# Patient Record
Sex: Female | Born: 1960 | Race: Black or African American | Hispanic: No | Smoking: Never smoker
Health system: Southern US, Community
[De-identification: ages and names within clinical notes are randomized; demographics above are authoritative.]

## PROBLEM LIST (undated history)

## (undated) DIAGNOSIS — IMO0002 Reserved for concepts with insufficient information to code with codable children: Secondary | ICD-10-CM

---

## 2000-12-14 ENCOUNTER — Other Ambulatory Visit: Admission: RE | Admit: 2000-12-14 | Discharge: 2000-12-14 | Payer: Self-pay | Admitting: Family Medicine

## 2000-12-30 ENCOUNTER — Ambulatory Visit (HOSPITAL_COMMUNITY): Admission: RE | Admit: 2000-12-30 | Discharge: 2000-12-30 | Payer: Self-pay | Admitting: Family Medicine

## 2000-12-30 ENCOUNTER — Encounter: Payer: Self-pay | Admitting: Family Medicine

## 2001-02-05 ENCOUNTER — Encounter: Payer: Self-pay | Admitting: Family Medicine

## 2001-02-05 ENCOUNTER — Encounter: Admission: RE | Admit: 2001-02-05 | Discharge: 2001-02-05 | Payer: Self-pay | Admitting: Family Medicine

## 2001-07-10 ENCOUNTER — Emergency Department (HOSPITAL_COMMUNITY): Admission: EM | Admit: 2001-07-10 | Discharge: 2001-07-10 | Payer: Self-pay | Admitting: Emergency Medicine

## 2001-07-10 ENCOUNTER — Encounter: Payer: Self-pay | Admitting: Emergency Medicine

## 2002-06-25 ENCOUNTER — Emergency Department (HOSPITAL_COMMUNITY): Admission: EM | Admit: 2002-06-25 | Discharge: 2002-06-25 | Payer: Self-pay | Admitting: *Deleted

## 2002-06-25 ENCOUNTER — Encounter: Payer: Self-pay | Admitting: *Deleted

## 2002-08-10 ENCOUNTER — Emergency Department (HOSPITAL_COMMUNITY): Admission: EM | Admit: 2002-08-10 | Discharge: 2002-08-10 | Payer: Self-pay | Admitting: *Deleted

## 2002-08-17 ENCOUNTER — Other Ambulatory Visit: Admission: RE | Admit: 2002-08-17 | Discharge: 2002-08-17 | Payer: Self-pay | Admitting: Family Medicine

## 2004-01-10 ENCOUNTER — Other Ambulatory Visit: Admission: RE | Admit: 2004-01-10 | Discharge: 2004-01-10 | Payer: Self-pay | Admitting: Obstetrics and Gynecology

## 2004-01-23 ENCOUNTER — Ambulatory Visit (HOSPITAL_COMMUNITY): Admission: RE | Admit: 2004-01-23 | Discharge: 2004-01-23 | Payer: Self-pay | Admitting: Obstetrics and Gynecology

## 2004-02-08 ENCOUNTER — Ambulatory Visit: Payer: Self-pay | Admitting: Orthopedic Surgery

## 2005-04-11 ENCOUNTER — Ambulatory Visit (HOSPITAL_COMMUNITY): Admission: RE | Admit: 2005-04-11 | Discharge: 2005-04-11 | Payer: Self-pay | Admitting: Family Medicine

## 2006-07-13 ENCOUNTER — Ambulatory Visit (HOSPITAL_COMMUNITY): Admission: RE | Admit: 2006-07-13 | Discharge: 2006-07-13 | Payer: Self-pay | Admitting: Family Medicine

## 2007-08-11 ENCOUNTER — Ambulatory Visit (HOSPITAL_COMMUNITY): Admission: RE | Admit: 2007-08-11 | Discharge: 2007-08-11 | Payer: Self-pay | Admitting: Family Medicine

## 2008-08-21 ENCOUNTER — Ambulatory Visit (HOSPITAL_COMMUNITY): Admission: RE | Admit: 2008-08-21 | Discharge: 2008-08-21 | Payer: Self-pay | Admitting: Family Medicine

## 2008-09-11 ENCOUNTER — Encounter: Admission: RE | Admit: 2008-09-11 | Discharge: 2008-09-11 | Payer: Self-pay | Admitting: Family Medicine

## 2009-06-25 ENCOUNTER — Encounter: Payer: Self-pay | Admitting: Orthopedic Surgery

## 2009-06-26 ENCOUNTER — Telehealth: Payer: Self-pay | Admitting: Orthopedic Surgery

## 2009-06-26 ENCOUNTER — Ambulatory Visit: Payer: Self-pay | Admitting: Orthopedic Surgery

## 2009-06-26 DIAGNOSIS — M5412 Radiculopathy, cervical region: Secondary | ICD-10-CM | POA: Insufficient documentation

## 2009-06-26 DIAGNOSIS — S139XXA Sprain of joints and ligaments of unspecified parts of neck, initial encounter: Secondary | ICD-10-CM

## 2009-06-26 DIAGNOSIS — M5020 Other cervical disc displacement, unspecified cervical region: Secondary | ICD-10-CM

## 2009-06-27 ENCOUNTER — Encounter (INDEPENDENT_AMBULATORY_CARE_PROVIDER_SITE_OTHER): Payer: Self-pay | Admitting: *Deleted

## 2009-06-27 DIAGNOSIS — R209 Unspecified disturbances of skin sensation: Secondary | ICD-10-CM

## 2009-06-29 ENCOUNTER — Encounter (INDEPENDENT_AMBULATORY_CARE_PROVIDER_SITE_OTHER): Payer: Self-pay | Admitting: *Deleted

## 2009-07-03 ENCOUNTER — Ambulatory Visit (HOSPITAL_COMMUNITY): Admission: RE | Admit: 2009-07-03 | Discharge: 2009-07-03 | Payer: Self-pay | Admitting: Orthopedic Surgery

## 2009-07-04 ENCOUNTER — Telehealth: Payer: Self-pay | Admitting: Orthopedic Surgery

## 2009-07-05 ENCOUNTER — Telehealth (INDEPENDENT_AMBULATORY_CARE_PROVIDER_SITE_OTHER): Payer: Self-pay | Admitting: *Deleted

## 2009-07-05 ENCOUNTER — Telehealth: Payer: Self-pay | Admitting: Orthopedic Surgery

## 2009-07-16 ENCOUNTER — Encounter: Payer: Self-pay | Admitting: Orthopedic Surgery

## 2009-08-01 ENCOUNTER — Encounter: Payer: Self-pay | Admitting: Orthopedic Surgery

## 2009-08-03 ENCOUNTER — Encounter (HOSPITAL_COMMUNITY): Admission: RE | Admit: 2009-08-03 | Discharge: 2009-09-02 | Payer: Self-pay | Admitting: Orthopedic Surgery

## 2009-08-03 ENCOUNTER — Encounter: Payer: Self-pay | Admitting: Orthopedic Surgery

## 2009-09-05 ENCOUNTER — Encounter: Payer: Self-pay | Admitting: Orthopedic Surgery

## 2009-09-05 ENCOUNTER — Encounter (HOSPITAL_COMMUNITY): Admission: RE | Admit: 2009-09-05 | Discharge: 2009-10-05 | Payer: Self-pay | Admitting: Orthopedic Surgery

## 2009-09-11 ENCOUNTER — Encounter: Payer: Self-pay | Admitting: Orthopedic Surgery

## 2009-10-03 ENCOUNTER — Telehealth: Payer: Self-pay | Admitting: Orthopedic Surgery

## 2009-10-15 ENCOUNTER — Ambulatory Visit (HOSPITAL_COMMUNITY): Admission: RE | Admit: 2009-10-15 | Discharge: 2009-10-15 | Payer: Self-pay | Admitting: Family Medicine

## 2010-02-25 ENCOUNTER — Encounter: Payer: Self-pay | Admitting: Family Medicine

## 2010-03-05 NOTE — Medication Information (Signed)
Summary: Tax adviser   Imported By: Cammie Sickle 06/26/2009 19:41:46  _____________________________________________________________________  External Attachment:    Type:   Image     Comment:   External Document

## 2010-03-05 NOTE — Miscellaneous (Signed)
Summary: Physical therapy order  Physical therapy order   Imported By: Cammie Sickle 06/26/2009 16:01:22  _____________________________________________________________________  External Attachment:    Type:   Image     Comment:   External Document

## 2010-03-05 NOTE — Progress Notes (Signed)
Summary: call from patient, fol'g physical therapy  Phone Note Call from Patient   Caller: Patient Summary of Call: Patient called to relay that she has finished physical therapy and had been doing better; states pain has started again from RT shoulder & into back of neck-no injury.   I scheduled for appointment, 10/15/09.  Please advise if any other or different recommendation. Initial call taken by: Cammie Sickle,  October 03, 2009 12:40 PM  Follow-up for Phone Call        she doesnt need to come in for that   i have seen her, rec treatment   niothing elsoe I can do at this opoint   she shoudl do her exercises and take the medications she has    Follow-up by: Fuller Canada MD,  October 04, 2009 8:29 AM  Additional Follow-up for Phone Call Additional follow up Details #1::        advised patient. Additional Follow-up by: Cammie Sickle,  October 04, 2009 11:02 AM

## 2010-03-05 NOTE — Progress Notes (Signed)
Summary: asking if okay to continue bowling  Phone Note Call from Patient   Caller: Patient Summary of Call: Patient asked about bowling. Should she discontinue at this time?  Ph [phone]? Initial call taken by: Cammie Sickle,  Jun 26, 2009 3:36 PM  Follow-up for Phone Call        yes Follow-up by: Fuller Canada MD,  Jun 26, 2009 3:55 PM  Additional Follow-up for Phone Call Additional follow up Details #1::        Tried calling patient, call not going through.  - Called and reached patient 5:23pm and advised. Additional Follow-up by: Cammie Sickle,  Jun 26, 2009 5:02 PM

## 2010-03-05 NOTE — Progress Notes (Signed)
Summary: Initial Evaluation  Initial Evaluation   Imported By: Jacklynn Ganong 06/26/2009 09:57:05  _____________________________________________________________________  External Attachment:    Type:   Image     Comment:   External Document

## 2010-03-05 NOTE — Miscellaneous (Signed)
Summary: PT progress note  PT progress note   Imported By: Jacklynn Ganong 09/05/2009 13:28:53  _____________________________________________________________________  External Attachment:    Type:   Image     Comment:   External Document

## 2010-03-05 NOTE — Assessment & Plan Note (Signed)
Summary: EVAL/TREAT CERVICAL PAIN/NEEDS XRAY/REF [facility]   Vital Signs:  Patient profile:   50 year old female Height:      65 inches Weight:      172 pounds Pulse rate:   72 / minute Resp:     16 per minute  Vitals Entered By: Fuller Canada MD (Jun 26, 2009 1:54 PM)  Visit Type:  new patient Referring Provider:  Dr. Regino Schultze Primary Provider:  [facility]  CC:  neck pain.  History of Present Illness: I saw Kari Fry in the office today for an initial visit.  She is a 50 years old woman with the complaint of:  neck pain.  Xrays today.  Meds:Hydrocodone 5, Naproxen, Colace.  This is a 50 year old female complains of increasing neck pain over the last 4 weeks without any history of injury.  Her activities include bowling.  She complains of headaches on the RIGHT side of her neck which radiated down into her shoulder and arm and down into her hand.  Her pain level is 8/10 it is constant without medication, the pain is always there it came on rather suddenly.  Pain relief is noted with Naprosyn and Vicodin.  She says there is no grip strength deficit or numbness just pain      Allergies: 1)  ! Sulfa 2)  ! * Chlorphen 3)  ! 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 Respiratory:  Complains of couch; denies short of breath, wheezing, tightness, pain on inspiration, and snoring . Gastrointestinal:  Complains of constipation; denies heartburn, nausea, vomiting, diarrhea, and blood in your stools. Neurologic:  Complains of tingling, unsteady gait, and dizziness; denies numbness, tremors, and seizure. Musculoskeletal:  Complains of joint pain, stiffness, and muscle pain; denies swelling,  instability, redness, and heat. Endocrine:  Complains of heat or cold intolerance; denies excessive thirst and exessive urination. Immunology:  Complains of seasonal allergies; denies sinus problems and allergic to bee stings.  The review of systems is negative for Cardiovascular, Genitourinary, Psychiatric, Skin, HEENT, and Hemoatologic.  Physical Exam  Skin:  intact without lesions or rashes Cervical Nodes:  no significant adenopathy Axillary Nodes:  no significant adenopathy Psych:  alert and cooperative; normal mood and affect; normal attention span and concentration Additional Exam:  She has weakness in her triceps bilaterally all the other muscles are normal in the upper extremities   Detailed Back/Spine Exam  General:    Well-developed, well-nourished, in no acute distress; alert and oriented x 3.    Gait:    Normal heel-toe gait pattern bilaterally.    Skin:    Intact with no erythema; no scarring.    Vascular:    dorsalis pedis and posterior tibial pulses 2+ and symmetric, capillary refill < 2 seconds, normal hair pattern, no evidence of ischemia.   Cervical Exam:  Inspection-deformity:    Abnormal    flexion seems normal there is some pain with extension and also with rotation Palpation-spinal tenderness:  Abnormal    Location:  C6-C7, C4 and 5, C3 and 4 Spurling Maneuver:    negative Hoffman's Sign:    Right:  negative    Left:  negative   Impression & Recommendations:  Problem # 1:  H N P-CERVICAL (ICD-722.0) Assessment  New  AP lateral cervical spine x-ray  xrays inferior ossicle at [facility] looks normal   Impression, normal appearing cervical spine except for a small ossicle at C4 inferior margin  Problem # 2:  CERVICAL RADICULITIS (ICD-723.4) Assessment: New  Problem # 3:  CERVICAL SPASM (ICD-847.0) Assessment: New  MRI to evaluate cervical spine for disc herniation  Nerve conduction study to rule out carpal tunnel syndrome and cervical  radiculopathy  Physical therapy to improve range of motion and decrease pain  Start prednisone 12 a pack 10 mg, Norflex 100 mg b.i.d. for spasm #60 one refill  Norco 5 mg one q.6 p.r.n. for pain #60 one refill    Her updated medication list for this problem includes:    Norco 5-325 Mg Tabs (Hydrocodone-acetaminophen) ..... One every 6 hours p.r.n. pain  Medications Added to Medication List This Visit: 1)  Norco 5-325 Mg Tabs (Hydrocodone-acetaminophen) .... One every 6 hours p.r.n. pain 2)  Prednisone (pak) 10 Mg Tabs (Prednisone) .Marland Kitchen.. 12 days as directed 3)  Norflex 100mg   .... One b.i.d.  Other Orders: New Patient Level III (16109) Cervical x-ray minimum 4 views (60454)  Patient Instructions: 1)  MRI C-SPINE  2)  NCS Radiculopathy  3)  Continue Vicodin and Naprosyn  4)  Physical therapy x 6 weeks  Prescriptions: NORFLEX 100MG  one b.i.d.  #60 x 1   Entered and Authorized by:   Fuller Canada MD   Signed by:   Fuller Canada MD on 06/27/2009   Method used:   Handwritten   RxID:   [id number] PREDNISONE (PAK) 10 MG TABS (PREDNISONE) 12 days as directed  #1 x 0   Entered and Authorized by:   Fuller Canada MD   Signed by:   Fuller Canada MD on 06/27/2009   Method used:   Handwritten   RxID:   [id number] NORCO 5-325 MG TABS (HYDROCODONE-ACETAMINOPHEN) one every 6 hours p.r.n. pain  #60 x 1   Entered and Authorized by:   Fuller Canada MD   Signed by:   Fuller Canada MD on 06/27/2009   Method used:   Handwritten   RxID:   [id number]

## 2010-03-05 NOTE — Letter (Signed)
Summary: History form  History form   Imported By: Jacklynn Ganong 06/29/2009 09:31:35  _____________________________________________________________________  External Attachment:    Type:   Image     Comment:   External Document

## 2010-03-05 NOTE — Progress Notes (Signed)
  Phone Note Call from Patient   Summary of Call: Kari Fry is scheduled for NCS 07/16/09 at 12:30 Initial call taken by: Jacklynn Ganong,  July 05, 2009 1:17 PM

## 2010-03-05 NOTE — Progress Notes (Signed)
Summary: call from patient MRI results + medication question  Phone Note Call from Patient   Caller: Patient Summary of Call: Patient called to relay that she will drop off MRI CD today.  Patient states "the 3 medications prescribed are no longer helping with the pain. Office note states Dr Romeo Apple is to call with MRI results.    Cell ph # [phone]   Work ph # [phone] X [phone]  Initial call taken by: Cammie Sickle,  July 05, 2009 10:27 AM  Follow-up for Phone Call        I KNOW ABOUT THIS ALREADY  Follow-up by: Fuller Canada MD,  July 05, 2009 10:29 AM  Additional Follow-up for Phone Call Additional follow up Details #1::        Medication question was included. Please advise. Additional Follow-up by: Cammie Sickle,  July 05, 2009 10:38 AM

## 2010-03-05 NOTE — Miscellaneous (Signed)
Summary: PT Clinical evaluation  PT Clinical evaluation   Imported By: Jacklynn Ganong 08/13/2009 08:12:57  _____________________________________________________________________  External Attachment:    Type:   Image     Comment:   External Document

## 2010-03-05 NOTE — Miscellaneous (Signed)
  advised to  go ahead and start physical therapy. Continue medications, refills are okay when she runs out.  She recalls after therapy has been completedClinical Lists Changes

## 2010-03-05 NOTE — Progress Notes (Signed)
Summary: call from Dr Wallace Cullens radiology dept  Phone Note From Other Clinic   Caller: Provider Summary of Call: Dr Jena Gauss at [facility] radiology called to phone in MRI report to Dr Romeo Apple (direct ph # [phone]).   Initial call taken by: Cammie Sickle,  July 04, 2009 11:18 AM

## 2010-03-05 NOTE — Miscellaneous (Signed)
Summary: mri aph neck 07/03/09 reg 530pm  Clinical Lists Changes  hc precert number [id number] expires 08/13/09, advised pt of appt, dr to call with results

## 2010-03-05 NOTE — Miscellaneous (Signed)
Summary: PT Discharge summary  PT Discharge summary   Imported By: Jacklynn Ganong 09/18/2009 12:03:53  _____________________________________________________________________  External Attachment:    Type:   Image     Comment:   External Document

## 2010-03-05 NOTE — Miscellaneous (Signed)
Summary: ncs uppers referral  Clinical Lists Changes  Problems: Added new problem of NUMBNESS, HAND (ICD-782.0) Orders: Added new Referral order of Neurology Referral (Neuro) - Signed  Appended Document: n[facility] uppers referral faxed to [facility] office

## 2010-06-21 NOTE — Op Note (Signed)
Kari Fry, Kari Fry                          ACCOUNT NO.:  [id number]   MEDICAL RECORD NO.:  [id number]                   PATIENT TYPE:  MS   LOCATION:                                       FACILITY:  [facility]   PHYSICIAN:  Althea Grimmer. Luther Parody, M.D.            DATE OF BIRTH:  [birth year]   DATE OF PROCEDURE:  08/31/2001  DATE OF DISCHARGE:  07/10/2001                                 OPERATIVE REPORT   PROCEDURE PERFORMED:  Endoscopic retrograde cholangiopancreatogram with  sphincterotomy and common bile duct stone extraction.   ENDOSCOPIST:  Althea Grimmer. Luther Parody, M.D.   INDICATIONS FOR PROCEDURE:  Obstructive jaundice. The admission history and  physical are dictated separately.   PREPARATION:  The patient is n.p.o. since clear liquid breakfast.   PREPROCEDURE MEDICATIONS:  She received 400 mg of ciprofloxacin IV.  She was  sedated with 130 mg of Demerol IV during the procedure and 10 mg of Versed.  In addition her throat was anesthetized with Cetacaine spray.  She was on  nasal cannula O2 and received 1.25 mg of Glucagon IV.   DESCRIPTION OF PROCEDURE:  The Olympus video duodenoscope was inserted by  mouth and advanced fairly easily through the stomach and duodenal bulb to  the second duodenum.  The scope was then retroflexed and the ampulla  visualized.  This was easily cannulated with the sphinctertome preloaded  with guidewire.  The initial injection led to some dye entering both ducts.  The catheter was repositioned and the guidewire was easily advanced up the  common bile duct.  Stones were immediately visualized in the distal common  bile duct on dye injection.  A moderate sphincterotomy was performed and a  balloon catheter exchanged with a graded balloon was made.  The balloon was  dilated to 8 mm and pulled through the common bile duct with minimal  delivery of stone material or debris; however, there appeared to be a  relatively large stone in the distal common bile duct.   12 mm balloon  inflation similarly did not remove the stone.  The balloon catheter was  removed.  The sphincterotomy was extended and the procedure was repeated.  More debris was delivered but a stone still appeared to be impacted.  A  third extension of the sphincterotomy appeared to open the internal aspect  of the sphincter better and on recannulation of the common bile duct with a  12 mm balloon, multiple stones were delivered which were at least 0.8 to 10  mm in size.  The bile then drained easily from the common bile duct which  appeared to be free of stone material.  The procedure was terminated.  The  patient tolerated the ERCP well.  Pulse, blood pressure and oximetry testing  were stable throughout. She was observed in recovery for one hour and  discharged to her hospital room, admitted for elective cholecystectomy  scheduled  for the morning.   IMPRESSION:  Successful endoscopic retrograde cholangiopancreatogram,  sphincterotomy and stone extraction.    PLAN:  The patient will be observed, hydrated and kept on clear fluids in  preparation for her cholecystectomy in the morning.                                               Althea Grimmer. Luther Parody, M.D.    PJS/MEDQ  D:  08/31/2001  T:  09/02/2001  Job:  [id number]   cc:   Skeet Simmer., M.D.  Fax: [phone]   Ellin Saba., M.D.

## 2010-09-12 ENCOUNTER — Encounter: Payer: Self-pay | Admitting: Orthopedic Surgery

## 2010-09-12 ENCOUNTER — Ambulatory Visit (INDEPENDENT_AMBULATORY_CARE_PROVIDER_SITE_OTHER): Payer: Commercial Managed Care - PPO | Admitting: Orthopedic Surgery

## 2010-09-12 DIAGNOSIS — M542 Cervicalgia: Secondary | ICD-10-CM

## 2010-09-12 DIAGNOSIS — M5030 Other cervical disc degeneration, unspecified cervical region: Secondary | ICD-10-CM

## 2010-09-12 NOTE — Patient Instructions (Addendum)
Don't pull any file cabinet drawer  2lb weight limit in each hand   Add traction daily

## 2010-09-12 NOTE — Progress Notes (Signed)
   50 year old female previously evaluated for neck pain had an MRI which showed multilevel degenerative disc protrusions without impingement or myelopathy.  She was treated with physical therapy, Norflex, anti-inflammatory and hydrocodone.  She eventually finished her physical therapy and her pain is persisting including some pain radiating up into the neck and head area.  She denies any numbness tingling or weakness of her upper extremities.  She is concerned about the amount of lifting she is having to do at work and would like a note saying that she can't lift heavy objects or pull on the heavy file cabinet.  She says that the therapy is too expensive although it did help and she liked it.  Current medications as noted primarily relying on 4 mg of ibuprofen with occasional hydrocodone 5 mg and Norflex 100 mg.  C2-3: There is a small central disc protrusion which touches the  ventral aspect of spinal cord but causes no focal nerve root  impingement or myelopathy.  C3-4: There is a small central disc protrusion which indents the  central aspect of the spinal cord without myelopathy or focal nerve  root impingement.  C4-5: There is a small central disc protrusion which touches the  ventral aspect of the spinal cord without myelopathy or focal nerve  root impingement.   Pain is increased when she is under stress, if she is doing any straining or lifting including Valsalva maneuver when having bowel movements  Clinical exam is benign  I certainly think she would benefit from lifting restrictions.  We will write her a note as she has requested.  She does not have any radicular symptoms although she does have some suggestion of cord pressure from the Valsalva maneuver but this is in no way constant.  I recommended she try chiropractic manipulation, cervical traction at home 5 pounds 15 minutes per day as needed continue ibuprofen and for breakthrough pain his hydrocodone.  Follow up as  needed

## 2010-11-12 ENCOUNTER — Other Ambulatory Visit (HOSPITAL_COMMUNITY): Payer: Self-pay | Admitting: Physician Assistant

## 2010-11-12 DIAGNOSIS — Z139 Encounter for screening, unspecified: Secondary | ICD-10-CM

## 2010-11-22 ENCOUNTER — Ambulatory Visit (HOSPITAL_COMMUNITY)
Admission: RE | Admit: 2010-11-22 | Discharge: 2010-11-22 | Disposition: A | Discharge status: Home / Self Care | Payer: BC Managed Care – PPO | Source: Ambulatory Visit | Attending: Physician Assistant | Admitting: Physician Assistant

## 2010-11-22 DIAGNOSIS — Z1231 Encounter for screening mammogram for malignant neoplasm of breast: Secondary | ICD-10-CM | POA: Insufficient documentation

## 2010-11-22 DIAGNOSIS — Z139 Encounter for screening, unspecified: Secondary | ICD-10-CM

## 2010-12-01 ENCOUNTER — Emergency Department (HOSPITAL_COMMUNITY): Payer: BC Managed Care – PPO

## 2010-12-01 ENCOUNTER — Emergency Department (HOSPITAL_COMMUNITY)
Admission: EM | Admit: 2010-12-01 | Discharge: 2010-12-01 | Disposition: A | Discharge status: Home / Self Care | Payer: BC Managed Care – PPO | Attending: Emergency Medicine | Admitting: Emergency Medicine

## 2010-12-01 DIAGNOSIS — X500XXA Overexertion from strenuous movement or load, initial encounter: Secondary | ICD-10-CM | POA: Insufficient documentation

## 2010-12-01 DIAGNOSIS — S93409A Sprain of unspecified ligament of unspecified ankle, initial encounter: Secondary | ICD-10-CM

## 2010-12-01 DIAGNOSIS — Y92009 Unspecified place in unspecified non-institutional (private) residence as the place of occurrence of the external cause: Secondary | ICD-10-CM | POA: Insufficient documentation

## 2010-12-01 HISTORY — DX: Reserved for concepts with insufficient information to code with codable children: IMO0002

## 2010-12-01 MED ORDER — HYDROCODONE-ACETAMINOPHEN 5-325 MG PO TABS
1.0000 | ORAL_TABLET | Freq: Once | ORAL | Status: AC
  Administered 2010-12-01: 1 via ORAL
  Filled 2010-12-01: qty 1

## 2010-12-01 MED ORDER — IBUPROFEN 800 MG PO TABS
800.0000 mg | ORAL_TABLET | Freq: Once | ORAL | Status: AC
  Administered 2010-12-01: 800 mg via ORAL
  Filled 2010-12-01: qty 1

## 2010-12-01 MED ORDER — IBUPROFEN 800 MG PO TABS: 800.0000 mg | ORAL_TABLET | Freq: Three times a day (TID) | ORAL | Status: AC | PRN

## 2010-12-01 MED ORDER — HYDROCODONE-ACETAMINOPHEN 5-325 MG PO TABS: 1.0000 | ORAL_TABLET | ORAL | Status: AC | PRN

## 2010-12-01 NOTE — ED Notes (Signed)
Pt presents with right ankle pain and swelling after stepping in a dip in her yard today. Pt twisted ankle. Pt unable to put weight on ankle.

## 2010-12-01 NOTE — ED Provider Notes (Signed)
History     CSN: [id number] Arrival date & time: 12/01/2010  5:22 PM   First MD Initiated Contact with Patient 12/01/10 1718      Chief Complaint  Patient presents with  . Ankle Injury    (Consider location/radiation/quality/duration/timing/severity/associated sxs/prior treatment) Patient is a 50 y.o. female presenting with lower extremity injury. The history is provided by the patient.  Ankle Injury This is a new (she twisted her ankle stepping in a hole in her yard today.) problem. The current episode started today. The problem occurs constantly. The problem has been unchanged. Associated symptoms include arthralgias and joint swelling. Pertinent negatives include no chest pain, fever, nausea, neck pain, numbness, rash, sore throat or weakness. The symptoms are aggravated by walking and standing. She has tried nothing for the symptoms. The treatment provided no relief.    Past Medical History  Diagnosis Date  . Bulging discs     History reviewed. No pertinent past surgical history.  History reviewed. No pertinent family history.  History  Substance Use Topics  . Smoking status: Never Smoker   . Smokeless tobacco: Not on file  . Alcohol Use: No    OB History    Grav Para Term Preterm Abortions TAB SAB Ect Mult Living                  Review of Systems  Constitutional: Negative for fever.  HENT: Negative for sore throat and neck pain.   Eyes: Negative.   Respiratory: Negative for chest tightness and shortness of breath.   Cardiovascular: Negative for chest pain.  Gastrointestinal: Negative for nausea.  Genitourinary: Negative.   Musculoskeletal: Positive for joint swelling and arthralgias.  Skin: Negative.  Negative for rash and wound.  Neurological: Negative for weakness and numbness.  Hematological: Negative.   Psychiatric/Behavioral: Negative.     Allergies  Fexofenadine and Sulfonamide derivatives  Home Medications   Current Outpatient Rx  Name Route  Sig Dispense Refill  . HYDROCODONE-ACETAMINOPHEN 5-325 MG PO TABS Oral Take 1 tablet by mouth every 6 (six) hours as needed.      Marland Kitchen HYDROCODONE-ACETAMINOPHEN 5-325 MG PO TABS Oral Take 1 tablet by mouth every 4 (four) hours as needed for pain. 15 tablet 0  . IBUPROFEN 800 MG PO TABS Oral Take 1 tablet (800 mg total) by mouth every 8 (eight) hours as needed for pain. 15 tablet 0  . LORATADINE 10 MG PO TABS Oral Take 10 mg by mouth daily.      . ORPHENADRINE CITRATE 100 MG PO TB12 Oral Take 100 mg by mouth 2 (two) times daily.        BP 111/80  Pulse 98  Temp(Src) 98.4 F (36.9 C) (Oral)  Resp 18  Ht 5\' 2"  (1.575 m)  Wt 166 lb (75.297 kg)  BMI 30.36 kg/m2  SpO2 100%  Physical Exam  Nursing note and vitals reviewed. Constitutional: She is oriented to person, place, and time. She appears well-developed and well-nourished.  HENT:  Head: Normocephalic.  Eyes: Conjunctivae are normal.  Neck: Normal range of motion.  Cardiovascular: Normal rate and intact distal pulses.  Exam reveals no decreased pulses.   Pulses:      Dorsalis pedis pulses are 2+ on the right side, and 2+ on the left side.       Posterior tibial pulses are 2+ on the right side, and 2+ on the left side.  Pulmonary/Chest: Effort normal.  Musculoskeletal: She exhibits edema and tenderness.  Right ankle: She exhibits decreased range of motion and swelling. She exhibits no deformity and normal pulse. tenderness. Lateral malleolus and CF ligament tenderness found. No head of 5th metatarsal and no proximal fibula tenderness found. Achilles tendon normal.       Pedal pulses intact,  Distal sensation intact.  Neurological: She is alert and oriented to person, place, and time. No sensory deficit.  Skin: Skin is warm, dry and intact.    ED Course  Procedures (including critical care time)  Labs Reviewed - No data to display Dg Ankle Complete Right  12/01/2010  *RADIOLOGY REPORT*  Clinical Data: Twisted ankle  RIGHT  ANKLE - COMPLETE 3+ VIEW  Comparison: None.  Findings: Lateral soft tissue swelling.  Normal alignment.  No fracture or effusion.  No degenerative change.  IMPRESSION: Negative for fracture.  Original Report Authenticated By: Camelia Phenes, M.D.     1. Ankle sprain       MDM  Aso, crutches,  Ibuprofen,  Hydrocodone.  RICE        Candis Musa, PA 12/01/10 1842

## 2010-12-02 NOTE — ED Provider Notes (Signed)
Medical screening examination/treatment/procedure(s) were performed by non-physician practitioner and as supervising physician I was immediately available for consultation/collaboration.   Gwyneth Sprout, MD 12/02/10 [phone]

## 2011-03-04 ENCOUNTER — Ambulatory Visit: Payer: BC Managed Care – PPO | Admitting: Orthopedic Surgery

## 2011-03-04 ENCOUNTER — Ambulatory Visit (INDEPENDENT_AMBULATORY_CARE_PROVIDER_SITE_OTHER): Payer: BC Managed Care – PPO | Admitting: Orthopedic Surgery

## 2011-03-04 ENCOUNTER — Encounter: Payer: Self-pay | Admitting: Orthopedic Surgery

## 2011-03-04 DIAGNOSIS — S93409A Sprain of unspecified ligament of unspecified ankle, initial encounter: Secondary | ICD-10-CM

## 2011-03-04 DIAGNOSIS — M76829 Posterior tibial tendinitis, unspecified leg: Secondary | ICD-10-CM

## 2011-03-04 DIAGNOSIS — S93401A Sprain of unspecified ligament of right ankle, initial encounter: Secondary | ICD-10-CM | POA: Insufficient documentation

## 2011-03-04 MED ORDER — HYDROCODONE-ACETAMINOPHEN 5-325 MG PO TABS: 1.0000 | ORAL_TABLET | Freq: Four times a day (QID) | ORAL | Status: DC | PRN

## 2011-03-04 NOTE — Progress Notes (Signed)
Patient ID: Kari Fry, female   DOB: 1960-05-11, 51 y.o.   MRN: [id number] .newprob   Pain RIGHT ankle  Started October 12  Secondary to injury patient stepped in a hole at her home.  She had a lot of swelling she went to the ER x-rays were negative she was placed in an ASO brace she will that about 2-3 weeks.  She is concerned because she still hurting.  She has more pain medial than lateral she has a history of arch problems.  She wear special shoes.  No such he can wear in a high heel shoe.  She does not have any catching or locking.  She is ALLERGIC to sulfa and amoxicillin  Review of systems noted for constipation and painful urination otherwise normal  Exam well-developed well-nourished female neurovascular exam intact lymph nodes negative skin intact neurologic exam normal.  She's awake alert and oriented x3  Ambulation is normal  She is tender on the medial side over the posterior tibial tendon.  She is a flattened arch.  She is a poor tiptoe test on repetition compared to the RIGHT.  Range of motion is otherwise normal strength seems normal.  Ankle is stable.  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 Pain one refill.

## 2011-03-04 NOTE — Patient Instructions (Signed)
Start PT 

## 2011-03-18 ENCOUNTER — Ambulatory Visit (HOSPITAL_COMMUNITY)
Admission: RE | Admit: 2011-03-18 | Discharge: 2011-03-18 | Disposition: A | Discharge status: Home / Self Care | Payer: BC Managed Care – PPO | Source: Ambulatory Visit | Attending: Internal Medicine | Admitting: Internal Medicine

## 2011-03-18 DIAGNOSIS — M6281 Muscle weakness (generalized): Secondary | ICD-10-CM | POA: Insufficient documentation

## 2011-03-18 DIAGNOSIS — M25676 Stiffness of unspecified foot, not elsewhere classified: Secondary | ICD-10-CM | POA: Insufficient documentation

## 2011-03-18 DIAGNOSIS — M25673 Stiffness of unspecified ankle, not elsewhere classified: Secondary | ICD-10-CM | POA: Insufficient documentation

## 2011-03-18 DIAGNOSIS — IMO0001 Reserved for inherently not codable concepts without codable children: Secondary | ICD-10-CM | POA: Insufficient documentation

## 2011-03-18 DIAGNOSIS — M25579 Pain in unspecified ankle and joints of unspecified foot: Secondary | ICD-10-CM | POA: Insufficient documentation

## 2011-03-18 NOTE — Evaluation (Signed)
Physical Therapy Evaluation  Patient Details  Name: Kari Fry MRN: [id number] Date of Birth: 22-May-1960  Today's Date: 03/18/2011 Time: 4098-1191 Time Calculation (min): 51 min Charges: 1 EV, 10 TE Visit#: 1  of 16   Re-eval: 04/17/11  Assessment Diagnosis: R ankle posterior tibial tendon tendonitis Next MD Visit: March 2013 Prior Therapy: none  Past Medical History:  Past Medical History  Diagnosis Date  . Bulging discs    Subjective Symptoms/Limitations Symptoms: Right ankle Oct 2012 sprained, now with continued pain in right ankle How long can you sit comfortably?: NA How long can you stand comfortably?: depends on the day How long can you walk comfortably?: can feel it whenever she walks.  Especially when she increases gait speed or incline on treadmill.   Pain Assessment Currently in Pain?: Yes Pain Score:   4 Pain Location: Ankle Pain Orientation: Right;Medial Pain Type: Chronic pain Pain Radiating Towards: posterior aspect of medial ankle Pain Onset: More than a month ago Pain Frequency: Intermittent Pain Relieving Factors: taking pain meds, did ice when she first sprained it, has tried heat pack on her ankle recently Effect of Pain on Daily Activities: takes 800 mg of ibuprofen, hurts when driving (switching from gas to breaks)  Prior Functioning  Prior Function Able to Take Stairs?: Yes (has some pain with doing stairs) Driving: Yes (has pain) Vocation: Full time employment Vocation Requirements: walking and up on her feet all day, 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    Assessment RLE Strength Right Ankle Dorsiflexion: 5/5 Right Ankle Plantar  Flexion: 5/5 Right Ankle Inversion: 4/5 Right Ankle Eversion: 4/5  Exercise/Treatments Mobility/Balance  Static Standing Balance Single Leg Stance - Right Leg: 3  Single Leg Stance - Left Leg: 36    Ankle Stretches Gastroc Stretch: 1 rep;30 seconds Ankle Exercises - Seated Toe Raise: Limitations Toe Raise Limitations: eccentric: up on both, down on right only x 8 reps Ankle Exercises - Supine T-Band: inversion and eversion, blue t-band 1 x 10 each  Physical Therapy Assessment and Plan Clinical Impression Statement: 51 y.o. female referred to [facility] for R ankle pain 4 mo s/p inversion sprain diagnosed by MD with posterior tibialis tendonitis.  She presents with decreased inversion and eversion ankle strength, significantly decreased balance on her right foot, and palpable pain on the posterior medial aspect of the ankle over her posterior tibial tendon.  She would benefit from PT for ankle strengthening, balance training and modalities for pain management.   Rehab Potential: Good PT Frequency: Min 2X/week PT Duration: 8 weeks PT Treatment/Interventions: Functional mobility training;Therapeutic activities;Therapeutic exercise;Balance training;Neuromuscular re-education;Patient/family education;Other (comment) (STM, modalities PRN for pain) PT Plan: Continue 2 xs/wk x 8 weeks, review HEP next session (t-band blue inversion and eversion, eccentric calf raises right leg, gastroc stretch), add soleus stretch, towel crunches, marbles, inversion and eversion with the towel, SLS practice, [place] pulsed, and ice at the end of the session.      Goals Home Exercise Program Pt will Perform Home Exercise Program: Independently PT Short Term Goals Time to Complete Short Term Goals: 4 weeks PT Short Term Goal 1: The patient will report average daily pain of less than 5/10 in her right ankle to show improved QOL and tolerance  of functional activities.   PT Short Term Goal 2: The patient will increase  SLS time to greater than 10 seconds on her right foot to show improved balance and decreased risk of re-spraining her right ankle.   PT Short Term Goal 3: The patient will increase her right ankle inversion and eversion strength by 1/2 muscle grade to show improved strength and ankle stability.   PT Long Term Goals Time to Complete Long Term Goals: 8 weeks PT Long Term Goal 1: The patient will report her average daily right ankle pain will be less than or equal to 3/10 to show improved QOL and tolerance of functional activities.   PT Long Term Goal 2: The patient will increase right ankle strength to 5/5 throughout to show improved ankle stability.   Long Term Goal 3: The patient will increase R SLS to equal L SLS to show equal balance and stability between ankles.  Long Term Goal 4: The patient will have no palpable pain behind her right medial malleolus to show improved sensativity to touch in this area.    Problem List Patient Active Problem List  Diagnoses  . H N P-CERVICAL  . CERVICAL RADICULITIS  . NUMBNESS, HAND  . CERVICAL SPASM  . Right ankle sprain  . Posterior tibial tendonitis   PT - End of Session Activity Tolerance: Patient tolerated treatment well General Behavior During Session: [facility] for tasks performed Cognition: [facility] for tasks performed PT Plan of Care PT Home Exercise Plan: see scanned report.   Consulted and Agree with Plan of Care: Patient  Rollene Rotunda. Ezeriah Luty, PT, DPT  03/18/2011, 6:41 PM  Physician Documentation Your signature is required to indicate approval of the treatment plan as stated above.  Please sign and either send electronically or make a copy of this report for your files and return this physician signed original.   Please mark one 1.__approve of plan  2. ___approve of plan with the following conditions.   ______________________________                                                          _____________________ Physician Signature                                                                                                              Date

## 2011-03-24 ENCOUNTER — Ambulatory Visit (HOSPITAL_COMMUNITY)
Admission: RE | Admit: 2011-03-24 | Discharge: 2011-03-24 | Disposition: A | Discharge status: Home / Self Care | Payer: BC Managed Care – PPO | Source: Ambulatory Visit | Attending: Orthopedic Surgery | Admitting: Orthopedic Surgery

## 2011-03-24 NOTE — Progress Notes (Signed)
Physical Therapy Treatment Patient Details  Name: Kari Fry MRN: [id number] Date of Birth: 1960/07/21  Today's Date: 03/24/2011 Time: 4098-1191 Time Calculation (min): 47 min Visit#: 2  of 16   Re-eval: 04/17/11 Charges: Therex x 27' [place] x 8' Ice x 10'  Subjective: Symptoms/Limitations Symptoms: Pt reports HEP compliance. Pt is pain free at entry. Pain Assessment Currently in Pain?: No/denies Pain Score: 0-No pain Pain Location: Ankle Pain Orientation: Right   Exercise/Treatments Ankle Exercises - Seated Towel Crunch: Limitations Towel Crunch Limitations: 1' Marble Pickup: 1x Heel Raises: Limitations Heel Raises Limitations: eccentric lower x 10 Ankle Exercises - Supine T-Band: inversion and eversion, blue t-band 1 x 10 each Ankle Exercises - Sidelying   Modalities Modalities: Cryotherapy;Ultrasound Cryotherapy Number Minutes Cryotherapy: 10 Minutes Cryotherapy Location: Ankle (Right) Type of Cryotherapy: Ice pack Ultrasound Ultrasound Location: R posterior tibialis Ultrasound Parameters: 1.0 w/cm2 pulsed x 8' Ultrasound Goals: Pain  Physical Therapy Assessment and Plan PT Assessment and Plan Clinical Impression Statement: Pt completes all exercises well. Attempted soleus stretch but pt unable to tolerate secondary to ankle pain. Ultrasound completed to posterior tibialis tendon to facilitate healing. Ice applied at end of session to limit inflammation. PT Plan: Continue to progress per PT POC. Assess effects of ultrasound next session.     Problem List Patient Active Problem List  Diagnoses  . H N P-CERVICAL  . CERVICAL RADICULITIS  . NUMBNESS, HAND  . CERVICAL SPASM  . Right ankle sprain  . Posterior tibial tendonitis    PT - End of Session Activity Tolerance: Patient tolerated treatment well General Behavior During Session: [facility] for tasks performed Cognition: [facility] for tasks performed  Antonieta Iba 03/24/2011, 5:38 PM

## 2011-03-26 ENCOUNTER — Ambulatory Visit (HOSPITAL_COMMUNITY): Payer: BC Managed Care – PPO | Admitting: *Deleted

## 2011-04-01 ENCOUNTER — Ambulatory Visit (HOSPITAL_COMMUNITY)
Admission: RE | Admit: 2011-04-01 | Discharge: 2011-04-01 | Disposition: A | Discharge status: Home / Self Care | Payer: BC Managed Care – PPO | Source: Ambulatory Visit | Attending: Internal Medicine | Admitting: Internal Medicine

## 2011-04-01 NOTE — Progress Notes (Signed)
Physical Therapy Treatment Patient Details  Name: CALIANNA KIM MRN: [id number] Date of Birth: 1960-06-04  Today's Date: 04/01/2011 Time: 6213-0865 Time Calculation (min): 44 min Visit#: 3  of 16   Re-eval: 04/17/11  Charge: therex:36 min [place] 8 min  Subjective: Symptoms/Limitations Symptoms: Pain free at entry "No pain unless I move it a certain way."  The [place] felt good last session. Pain Assessment Currently in Pain?: No/denies  Objective:   Exercise/Treatments Ankle Stretches Gastroc Stretch: 1 rep;30 seconds;Limitations Gastroc Stretch Limitations: with slant board Ankle Exercises - Standing SLS: 3x 30" with HHA Heel Raises: Limitations;10 reps Heel Raises Limitations: with eccentric control lowering Toe Raise: 10 reps;Limitations Toe Raise Limitations: with eccentric lowering Ankle Exercises - Seated Towel Crunch: 3 reps Towel Inversion/Eversion: 3 reps Marble Pickup: 3x  Heel Raises: Limitations Heel Raises Limitations: eccentric lower x 10 Toe Raise: Limitations Toe Raise Limitations: eccentric: up on both, down on right only x 8 reps Ankle Exercises - Supine T-Band: plantar flexion, dorsi flexion, inversion, and eversion, blue t-band 1 x 15 each Ankle Exercises - Sidelying   Modalities Modalities: Ultrasound Ultrasound Ultrasound Location: R posterior tibialis Ultrasound Parameters: 1.0 w/cm2 pulsed x 8' Ultrasound Goals: Pain  Physical Therapy Assessment and Plan PT Assessment and Plan Clinical Impression Statement: Progressed some exercises to standing, pt able to complete with no c/o.  Unstable ankle stragety noted with SLS, pt required HHA to prevent falls.  Pt stated good relief following [place], pt requested no ice at end of session, pt recommended to ice at home tonight to limit inflammation.   PT Plan: Continue progressing current POC, progress to standing activities as tolerated, add weight with towel inversion/eversion.      Goals    Problem  List Patient Active Problem List  Diagnoses  . H N P-CERVICAL  . CERVICAL RADICULITIS  . NUMBNESS, HAND  . CERVICAL SPASM  . Right ankle sprain  . Posterior tibial tendonitis    PT - End of Session Activity Tolerance: Patient tolerated treatment well General Behavior During Session: [facility] for tasks performed Cognition: [facility] for tasks performed  Juel Burrow, PTA 04/01/2011, 5:41 PM

## 2011-04-04 ENCOUNTER — Ambulatory Visit (HOSPITAL_COMMUNITY)
Admission: RE | Admit: 2011-04-04 | Discharge: 2011-04-04 | Disposition: A | Discharge status: Home / Self Care | Payer: BC Managed Care – PPO | Source: Ambulatory Visit | Attending: Internal Medicine | Admitting: Internal Medicine

## 2011-04-04 DIAGNOSIS — M25579 Pain in unspecified ankle and joints of unspecified foot: Secondary | ICD-10-CM | POA: Insufficient documentation

## 2011-04-04 DIAGNOSIS — M6281 Muscle weakness (generalized): Secondary | ICD-10-CM | POA: Insufficient documentation

## 2011-04-04 DIAGNOSIS — M25673 Stiffness of unspecified ankle, not elsewhere classified: Secondary | ICD-10-CM | POA: Insufficient documentation

## 2011-04-04 DIAGNOSIS — IMO0001 Reserved for inherently not codable concepts without codable children: Secondary | ICD-10-CM | POA: Insufficient documentation

## 2011-04-04 DIAGNOSIS — M25676 Stiffness of unspecified foot, not elsewhere classified: Secondary | ICD-10-CM | POA: Insufficient documentation

## 2011-04-04 NOTE — Progress Notes (Signed)
Physical Therapy Treatment Patient Details  Name: Kari Fry MRN: [id number] Date of Birth: 1960-05-22  Today's Date: 04/04/2011 Time: 4098-1191 Time Calculation (min): 42 min Visit#: 4  of 16   Re-eval: 04/17/11  Charge: therex 25 min [place] 8 min  Subjective: Symptoms/Limitations Symptoms: Pain free today, ankle feeling good. Pain Assessment Currently in Pain?: No/denies  Objective:   Exercise/Treatments Ankle Stretches Gastroc Stretch: 3 reps;30 seconds;Limitations Gastroc Stretch Limitations: with slant board Aerobic Exercises Tread Mill: 9' @ 2.5 Ankle Exercises - Standing SLS: 24" max of 3 with no HHA Heel Raises: Limitations;10 reps Heel Raises Limitations: with eccentric control lowering Toe Raise: 10 reps;Limitations Toe Raise Limitations: with eccentric lowering Other Standing Ankle Exercises: plantar st on 6in step 3x 20" Ankle Exercises - Seated Towel Inversion/Eversion: 3 reps;Weights Towel Inversion/Eversion Weights (lbs): 3# Marble Pickup: 3x  Heel Raises: Limitations Heel Raises Limitations: standing Toe Raise: Limitations Toe Raise Limitations: standing Modalities Modalities: Ultrasound Ultrasound Ultrasound Location: Medial malleoli, R posterior tib Ultrasound Parameters: 1.0 w/cm2 3 MHz pulsed x 8' Ultrasound Goals: Pain  Physical Therapy Assessment and Plan PT Assessment and Plan Clinical Impression Statement: Ankle stragety improving, able to SLS with no HHA today.  Able to add weights wtih inversion/eversion with noted muscular fatigue especially with eversion but able to complete with no c/o.   PT Plan: Continue with current POC, begin ankle plantar/dorsiflexion leg press machine low weight focus on eccentric control next session.     Goals    Problem List Patient Active Problem List  Diagnoses  . H N P-CERVICAL  . CERVICAL RADICULITIS  . NUMBNESS, HAND  . CERVICAL SPASM  . Right ankle sprain  . Posterior tibial tendonitis    PT  - End of Session Activity Tolerance: Patient tolerated treatment well General Behavior During Session: [facility] for tasks performed Cognition: [facility] for tasks performed  GP No functional reporting required  Juel Burrow 04/04/2011, 5:38 PM

## 2011-04-07 ENCOUNTER — Ambulatory Visit (HOSPITAL_COMMUNITY)
Admission: RE | Admit: 2011-04-07 | Discharge: 2011-04-07 | Disposition: A | Discharge status: Home / Self Care | Payer: BC Managed Care – PPO | Source: Ambulatory Visit | Attending: Internal Medicine | Admitting: Internal Medicine

## 2011-04-07 NOTE — Progress Notes (Signed)
Physical Therapy Treatment Patient Details  Name: Kari Fry MRN: [id number] Date of Birth: 11/28/1960  Today's Date: 04/07/2011 Time: 4782-9562 Time Calculation (min): 39 min Visit#: 5  of 16   Re-eval: 04/17/11 Charges: Therex x 35' Manual x 5' Ultrasound x 8'  Subjective: Symptoms/Limitations Symptoms: Pt is pain free at entry. Pain Assessment Currently in Pain?: No/denies Pain Score: 0-No pain  Exercise/Treatments Ankle Stretches Gastroc Stretch: 3 reps;30 seconds;Limitations Gastroc Stretch Limitations: with slant board Ankle Exercises - Standing SLS: 41" max of 3 with no HHA Heel Raises: 15 reps Heel Raises Limitations: with eccentric control lowering Toe Raise: 15 reps Other Standing Ankle Exercises: plantar st on step 3x 30" Ankle Exercises - Seated Towel Inversion/Eversion: 5 reps Towel Inversion/Eversion Weights (lbs): 3# Modalities Modalities: Ultrasound Manual Therapy Manual Therapy: Massage Massage: Gentle STM to posterior tibialis tendon x 5' Ultrasound Ultrasound Location: R posterior tib Ultrasound Parameters: 1.0 w/cm2 3 MHz x 8'  Ultrasound Goals: Pain  Physical Therapy Assessment and Plan PT Assessment and Plan Clinical Impression Statement: Pt continues to displays improvements in strength and stability. Pt is without complaint throughout session. Pt reports that the ultrasound seems to be helping. Gentle massage completed to post tib tendon to decrease tightness and adhesions. Pt reports no increase in pain at end of session. PT Plan: Continue to progress per PT POC.     Problem List Patient Active Problem List  Diagnoses  . H N P-CERVICAL  . CERVICAL RADICULITIS  . NUMBNESS, HAND  . CERVICAL SPASM  . Right ankle sprain  . Posterior tibial tendonitis    PT - End of Session Activity Tolerance: Patient tolerated treatment well General Behavior During Session: [facility] for tasks performed Cognition: [facility] for tasks performed    Antonieta Iba 04/07/2011, 5:43 PM

## 2011-04-09 ENCOUNTER — Telehealth (HOSPITAL_COMMUNITY): Payer: Self-pay

## 2011-04-09 ENCOUNTER — Ambulatory Visit (HOSPITAL_COMMUNITY): Payer: BC Managed Care – PPO | Admitting: *Deleted

## 2011-04-14 ENCOUNTER — Ambulatory Visit (HOSPITAL_COMMUNITY): Payer: BC Managed Care – PPO | Admitting: *Deleted

## 2011-04-14 ENCOUNTER — Telehealth (HOSPITAL_COMMUNITY): Payer: Self-pay

## 2011-04-16 ENCOUNTER — Ambulatory Visit (INDEPENDENT_AMBULATORY_CARE_PROVIDER_SITE_OTHER): Payer: BC Managed Care – PPO | Admitting: Orthopedic Surgery

## 2011-04-16 ENCOUNTER — Encounter: Payer: Self-pay | Admitting: Orthopedic Surgery

## 2011-04-16 VITALS — BP 92/60 | Ht 62.0 in | Wt 166.0 lb

## 2011-04-16 DIAGNOSIS — M76829 Posterior tibial tendinitis, unspecified leg: Secondary | ICD-10-CM

## 2011-04-16 NOTE — Progress Notes (Signed)
Patient ID: Kari Fry, female   DOB: [birth year], 51 y.o.   MRN: [id number] Chief Complaint  Patient presents with  . Follow-up    6 week recheck on right ankle and foot.   Posterior tibial tendon dysfunction grade 1/2 with tenosynovitis  The patient was moderately compliant with her physical therapy program secondary to illness in her family she would like to continue.  She has been somewhat compliant with home exercises.  She is having some discomfort in the opposite foot  On exam she ambulates normally.  She has some tenderness over the posterior tibial tendon the range of motion ankle is normal her strength in the tendon is normal the ankle joint is stable neurovascular exam is normal  Impression posterior tibial tendon dysfunction type II  Continue exercise program follow up as needed

## 2011-04-16 NOTE — Patient Instructions (Signed)
HEP (home exercise program)

## 2011-04-17 ENCOUNTER — Ambulatory Visit (HOSPITAL_COMMUNITY)
Admission: RE | Admit: 2011-04-17 | Discharge: 2011-04-17 | Disposition: A | Discharge status: Home / Self Care | Payer: BC Managed Care – PPO | Source: Ambulatory Visit | Attending: Internal Medicine | Admitting: Internal Medicine

## 2011-04-17 NOTE — Progress Notes (Signed)
Physical Therapy Evaluation  Patient Details  Name: Kari Fry MRN: [id number] Date of Birth: 01/06/1961  Today's Date: 04/17/2011 Time: 8469-6295 Visit#: 6 of 16 Re-eval:    Charge: MMT 1 unit Therex 30 min  Subjective Symptoms/Limitations Symptoms: I hit it the other day and it hurt, not hurting today unless I move it a certain way.  MD happy with my progress. Pain Assessment Currently in Pain?: No/denies  Objective:   Assessment RLE Strength Right Ankle Dorsiflexion: 5/5 Right Ankle Plantar Flexion: 5/5 Right Ankle Inversion: 5/5 ((was 4/5)) Right Ankle Eversion: 5/5 ((was 4/5))  Exercise/Treatments Mobility/Balance  Static Standing Balance Single Leg Stance - Right Leg: 60  Single Leg Stance - Left Leg: 60    Ankle Stretches Plantar Fascia Stretch: 3 reps;30 seconds Gastroc Stretch: 3 reps;30 seconds;Limitations Gastroc Stretch Limitations: with slant board Ankle Exercises - Standing BAPS: Standing;Level 3;5 reps;Limitations BAPS Limitations: with parallel bars for support SLS: B >1=" first attempt Heel Raises: 15 reps Heel Raises Limitations: with eccentric control lowering 5"  Toe Raise: 15 reps Ankle Exercises - Seated Towel Inversion/Eversion: 3 reps;Weights Towel Inversion/Eversion Weights (lbs): 3  Physical Therapy Assessment and Plan    A: Re-eval complete, pt has had 6 OPPT sessions and has met all 3/3 STG and 4/4 LTGs. Pt with improved balance, able to SLS >1' B, pain free with palpation to R medial malleollus and strength 5/5 for all movements. Pt not compliant with HEP, following discussion about importance of continuing HEP pt stated she would improve compliance, reviewed HEP and pt able to perform all correctly without difficulty. P: D/C to HEP  Goals Home Exercise Program Pt will Perform Home Exercise Program: Independently PT Short Term Goals Time to Complete Short Term Goals: 4 weeks PT Short Term Goal 1: The patient will report average  daily pain of less than 5/10 in her right ankle to show improved QOL and tolerance of functional activities.   PT Short Term Goal 2: The patient will increase SLS time to greater than 10 seconds on her right foot to show improved balance and decreased risk of re-spraining her right ankle.   PT Short Term Goal 3: The patient will increase her right ankle inversion and eversion strength by 1/2 muscle grade to show improved strength and ankle stability.   PT Long Term Goals Time to Complete Long Term Goals: 8 weeks PT Long Term Goal 1: The patient will report her average daily right ankle pain will be less than or equal to 3/10 to show improved QOL and tolerance of functional activities.   PT Long Term Goal 1 - Progress: Met PT Long Term Goal 2: The patient will increase right ankle strength to 5/5 throughout to show improved ankle stability.   PT Long Term Goal 2 - Progress: Met Long Term Goal 3: The patient will increase R SLS to equal L SLS to show equal balance and stability between ankles.  Long Term Goal 3 Progress: Met Long Term Goal 4: The patient will have no palpable pain behind her right medial malleolus to show improved sensativity to touch in this area.   Long Term Goal 4 Progress: Met  Problem List Patient Active Problem List  Diagnoses  . H N P-CERVICAL  . CERVICAL RADICULITIS  . NUMBNESS, HAND  . CERVICAL SPASM  . Right ankle sprain  . Posterior tibial tendonitis       Juel Burrow, PTA 04/17/2011, 9:09 PM

## 2011-12-01 ENCOUNTER — Other Ambulatory Visit (HOSPITAL_COMMUNITY): Payer: Self-pay | Admitting: Obstetrics and Gynecology

## 2011-12-01 DIAGNOSIS — Z139 Encounter for screening, unspecified: Secondary | ICD-10-CM

## 2011-12-08 ENCOUNTER — Ambulatory Visit (HOSPITAL_COMMUNITY)
Admission: RE | Admit: 2011-12-08 | Discharge: 2011-12-08 | Disposition: A | Discharge status: Home / Self Care | Payer: BC Managed Care – PPO | Source: Ambulatory Visit | Attending: Obstetrics and Gynecology | Admitting: Obstetrics and Gynecology

## 2011-12-08 DIAGNOSIS — Z139 Encounter for screening, unspecified: Secondary | ICD-10-CM

## 2011-12-08 DIAGNOSIS — Z1231 Encounter for screening mammogram for malignant neoplasm of breast: Secondary | ICD-10-CM | POA: Insufficient documentation

## 2012-12-02 ENCOUNTER — Other Ambulatory Visit (HOSPITAL_COMMUNITY): Payer: Self-pay | Admitting: Obstetrics and Gynecology

## 2012-12-02 DIAGNOSIS — Z139 Encounter for screening, unspecified: Secondary | ICD-10-CM

## 2012-12-13 ENCOUNTER — Ambulatory Visit (HOSPITAL_COMMUNITY)
Admission: RE | Admit: 2012-12-13 | Discharge: 2012-12-13 | Disposition: A | Discharge status: Home / Self Care | Payer: BC Managed Care – PPO | Source: Ambulatory Visit | Attending: Obstetrics and Gynecology | Admitting: Obstetrics and Gynecology

## 2012-12-13 DIAGNOSIS — Z1231 Encounter for screening mammogram for malignant neoplasm of breast: Secondary | ICD-10-CM | POA: Insufficient documentation

## 2012-12-13 DIAGNOSIS — Z139 Encounter for screening, unspecified: Secondary | ICD-10-CM

## 2012-12-22 ENCOUNTER — Emergency Department (HOSPITAL_COMMUNITY)
Admission: EM | Admit: 2012-12-22 | Discharge: 2012-12-22 | Disposition: A | Discharge status: Home / Self Care | Payer: BC Managed Care – PPO | Attending: Emergency Medicine | Admitting: Emergency Medicine

## 2012-12-22 ENCOUNTER — Encounter (HOSPITAL_COMMUNITY): Payer: Self-pay | Admitting: Emergency Medicine

## 2012-12-22 DIAGNOSIS — Z8739 Personal history of other diseases of the musculoskeletal system and connective tissue: Secondary | ICD-10-CM | POA: Insufficient documentation

## 2012-12-22 DIAGNOSIS — S61209A Unspecified open wound of unspecified finger without damage to nail, initial encounter: Secondary | ICD-10-CM | POA: Insufficient documentation

## 2012-12-22 DIAGNOSIS — Y929 Unspecified place or not applicable: Secondary | ICD-10-CM | POA: Insufficient documentation

## 2012-12-22 DIAGNOSIS — Y9389 Activity, other specified: Secondary | ICD-10-CM | POA: Insufficient documentation

## 2012-12-22 DIAGNOSIS — W278XXA Contact with other nonpowered hand tool, initial encounter: Secondary | ICD-10-CM | POA: Insufficient documentation

## 2012-12-22 MED ORDER — HYDROCODONE-ACETAMINOPHEN 5-325 MG PO TABS: 1.0000 | ORAL_TABLET | ORAL | Status: DC | PRN

## 2012-12-22 MED ORDER — LIDOCAINE HCL (PF) 2 % IJ SOLN
INTRAMUSCULAR | Status: AC
  Administered 2012-12-22: 20:00:00
  Filled 2012-12-22: qty 10

## 2012-12-22 NOTE — ED Notes (Signed)
Pt reports shaved very tip of r index finger off while peeling potatoes.  Bleeding controlled with pressure.

## 2012-12-24 NOTE — ED Provider Notes (Signed)
CSN: [id number]     Arrival date & time 12/22/12  4098 History   First MD Initiated Contact with Patient 12/22/12 1834     Chief Complaint  Patient presents with  . Laceration   (Consider location/radiation/quality/duration/timing/severity/associated sxs/prior Treatment) HPI Comments: Kari Fry is a 52 y.o. Female presenting with an avulsion laceration to her right index finger and nail plate just prior to arrival while using a vegetable slicer.  She has been able to control bleeding with direct pressure.  She has moderate constant pain at the wound site but denies numbness in the fingertip.  Her tetanus is utd.     The history is provided by the patient.    Past Medical History  Diagnosis Date  . Bulging discs    History reviewed. No pertinent past surgical history. No family history on file. History  Substance Use Topics  . Smoking status: Never Smoker   . Smokeless tobacco: Not on file  . Alcohol Use: No   OB History   Grav Para Term Preterm Abortions TAB SAB Ect Mult Living                 Review of Systems  Constitutional: Negative for fever and chills.  Gastrointestinal: Negative for nausea.  Skin: Positive for wound.  Neurological: Negative for numbness.    Allergies  Fexofenadine and Sulfonamide derivatives  Home Medications   Current Outpatient Rx  Name  Route  Sig  Dispense  Refill  . acetaminophen (TYLENOL) 500 MG tablet   Oral   Take 1,000 mg by mouth every 6 (six) hours as needed. Pain         . cetirizine (ZYRTEC) 10 MG tablet   Oral   Take 10 mg by mouth daily.         Marland Kitchen estrogens conjugated, synthetic B, (ENJUVIA) 0.3 MG tablet   Oral   Take 0.3 mg by mouth daily.         . traMADol-acetaminophen (ULTRACET) 37.5-325 MG per tablet   Oral   Take 1 tablet by mouth 3 (three) times daily as needed. pain         . HYDROcodone-acetaminophen (NORCO/VICODIN) 5-325 MG per tablet   Oral   Take 1 tablet by mouth every 4 (four) hours  as needed for moderate pain.   20 tablet   0    BP 137/62  Pulse 65  Temp(Src) 97.6 F (36.4 C) (Oral)  Resp 20  Ht 5\' 3"  (1.6 m)  Wt 173 lb (78.472 kg)  BMI 30.65 kg/m2  SpO2 100%  LMP 12/19/2012 Physical Exam  Constitutional: She is oriented to person, place, and time. She appears well-developed and well-nourished.  HENT:  Head: Normocephalic.  Cardiovascular: Normal rate.   Pulmonary/Chest: Effort normal.  Musculoskeletal: She exhibits tenderness.  Neurological: She is alert and oriented to person, place, and time. No sensory deficit.  Skin: Laceration noted.  V shaped wedge of missing nail plate at the distal right index finger.  The nail bed is shallowly avulsed and hemostatic.  Distal sensation is intact.     ED Course  Procedures (including critical care time)  Pt's wound was soaked in saline/betadine solution.  The fingertip was dressed with xeroform and bulky dressing after giving a digital block with 2 cc of 2% lidocaine without epi,  In sterile fashion, as patient had significant discomfort from this injury.  She tolerated this well.   Labs Review Labs Reviewed - No data to display Imaging  Review No results found.  EKG Interpretation   None       MDM   1. Fingertip avulsion, initial encounter   2. Avulsion of nail plate, initial encounter    Advised twice daily dressing changes after first 24 hours, soap and water wash.  Anticipate complete healing without complication, although advised f/u with pcp prn.    Burgess Amor, PA-C 12/24/12 1325

## 2012-12-24 NOTE — ED Provider Notes (Signed)
Medical screening examination/treatment/procedure(s) were performed by non-physician practitioner and as supervising physician I was immediately available for consultation/collaboration.  Flint Melter, MD 12/24/12 [phone]

## 2015-02-04 HISTORY — PX: BREAST BIOPSY: SHX20

## 2015-05-29 ENCOUNTER — Other Ambulatory Visit (HOSPITAL_COMMUNITY): Payer: Self-pay | Admitting: Obstetrics and Gynecology

## 2015-05-29 DIAGNOSIS — N6452 Nipple discharge: Secondary | ICD-10-CM

## 2015-06-05 ENCOUNTER — Other Ambulatory Visit (HOSPITAL_COMMUNITY): Payer: Self-pay | Admitting: Obstetrics and Gynecology

## 2015-06-05 ENCOUNTER — Encounter (HOSPITAL_COMMUNITY): Payer: Self-pay

## 2015-06-05 ENCOUNTER — Ambulatory Visit (HOSPITAL_COMMUNITY)
Admission: RE | Admit: 2015-06-05 | Discharge: 2015-06-05 | Disposition: A | Discharge status: Home / Self Care | Payer: BLUE CROSS/BLUE SHIELD | Source: Ambulatory Visit | Attending: Obstetrics and Gynecology | Admitting: Obstetrics and Gynecology

## 2015-06-05 DIAGNOSIS — N63 Unspecified lump in breast: Secondary | ICD-10-CM | POA: Insufficient documentation

## 2015-06-05 DIAGNOSIS — N6452 Nipple discharge: Secondary | ICD-10-CM | POA: Diagnosis present

## 2015-06-12 ENCOUNTER — Ambulatory Visit (HOSPITAL_COMMUNITY)
Admission: RE | Admit: 2015-06-12 | Discharge: 2015-06-12 | Disposition: A | Discharge status: Home / Self Care | Payer: BLUE CROSS/BLUE SHIELD | Source: Ambulatory Visit | Attending: Obstetrics and Gynecology | Admitting: Obstetrics and Gynecology

## 2015-06-12 ENCOUNTER — Other Ambulatory Visit (HOSPITAL_COMMUNITY): Payer: Self-pay | Admitting: Obstetrics and Gynecology

## 2015-06-12 DIAGNOSIS — N6320 Unspecified lump in the left breast, unspecified quadrant: Secondary | ICD-10-CM

## 2015-06-12 DIAGNOSIS — N63 Unspecified lump in breast: Secondary | ICD-10-CM | POA: Diagnosis present

## 2015-06-12 DIAGNOSIS — D242 Benign neoplasm of left breast: Secondary | ICD-10-CM | POA: Insufficient documentation

## 2015-06-12 DIAGNOSIS — N6452 Nipple discharge: Secondary | ICD-10-CM

## 2015-06-12 MED ORDER — LIDOCAINE HCL (PF) 1 % IJ SOLN
INTRAMUSCULAR | Status: AC
  Filled 2015-06-12: qty 5

## 2016-07-28 DIAGNOSIS — M2550 Pain in unspecified joint: Secondary | ICD-10-CM | POA: Diagnosis not present

## 2016-07-28 DIAGNOSIS — L989 Disorder of the skin and subcutaneous tissue, unspecified: Secondary | ICD-10-CM | POA: Diagnosis not present

## 2017-01-10 IMAGING — MG MM AP DIAG 20 MIN
4 series · 6 of 12 positions shown · non-contrast
Comparison: Previous exams, the most recent 04/04/2015

CLINICAL DATA: 54-year-old patient with recent bloody nipple
discharge on the left. The left nipple discharge occurred for
approximately 7 consecutive days, beginning as bloody, then becoming
yellowish. This discharge was approximately 3 weeks ago. Patient
states that she has never had nipple discharge prior to these
episodes recently. She does not palpate a lump.

EXAM:
DIGITAL DIAGNOSTIC LEFT MAMMOGRAM WITH CAD
ULTRASOUND LEFT BREAST

[L CC]
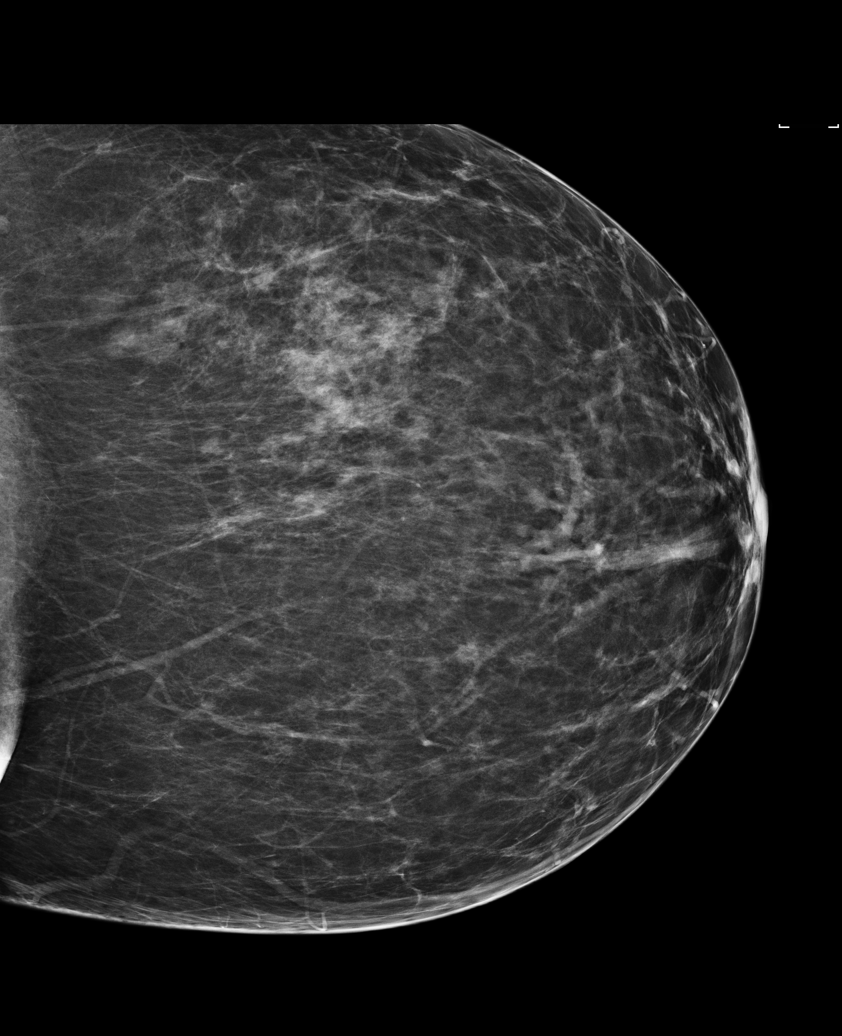

[L MLO]
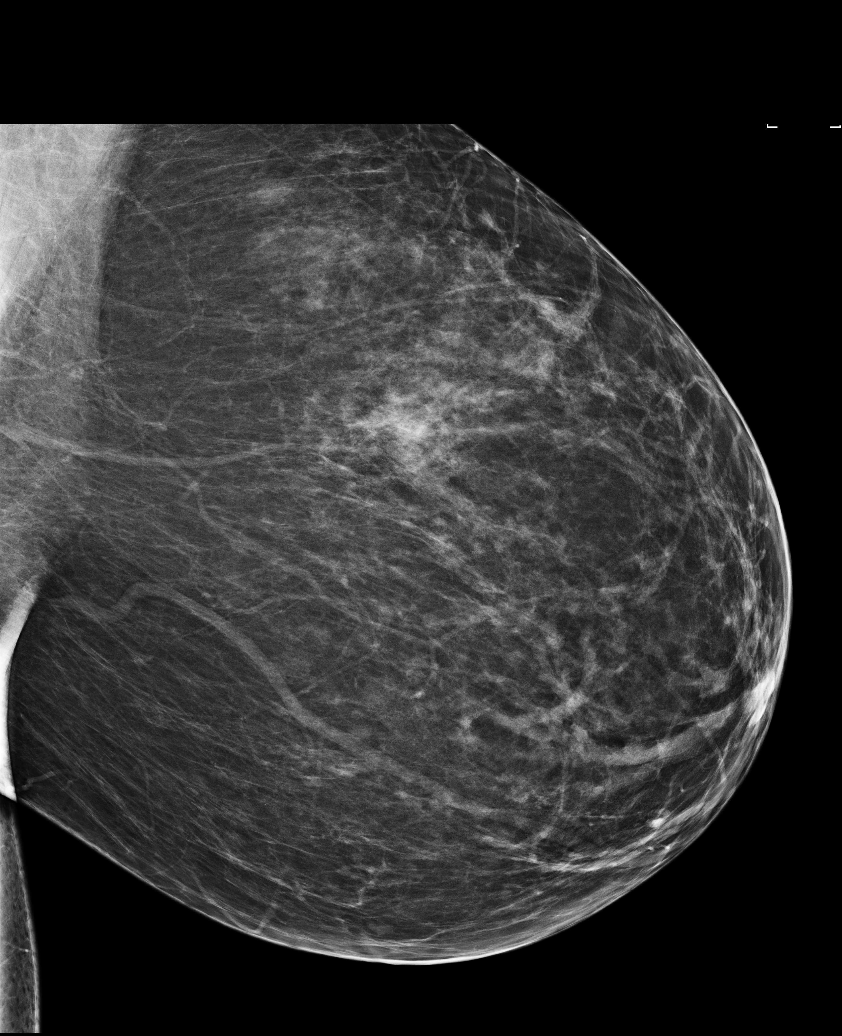

[L CC tomo · 3 of 76 frames shown]
[frame 25/76]
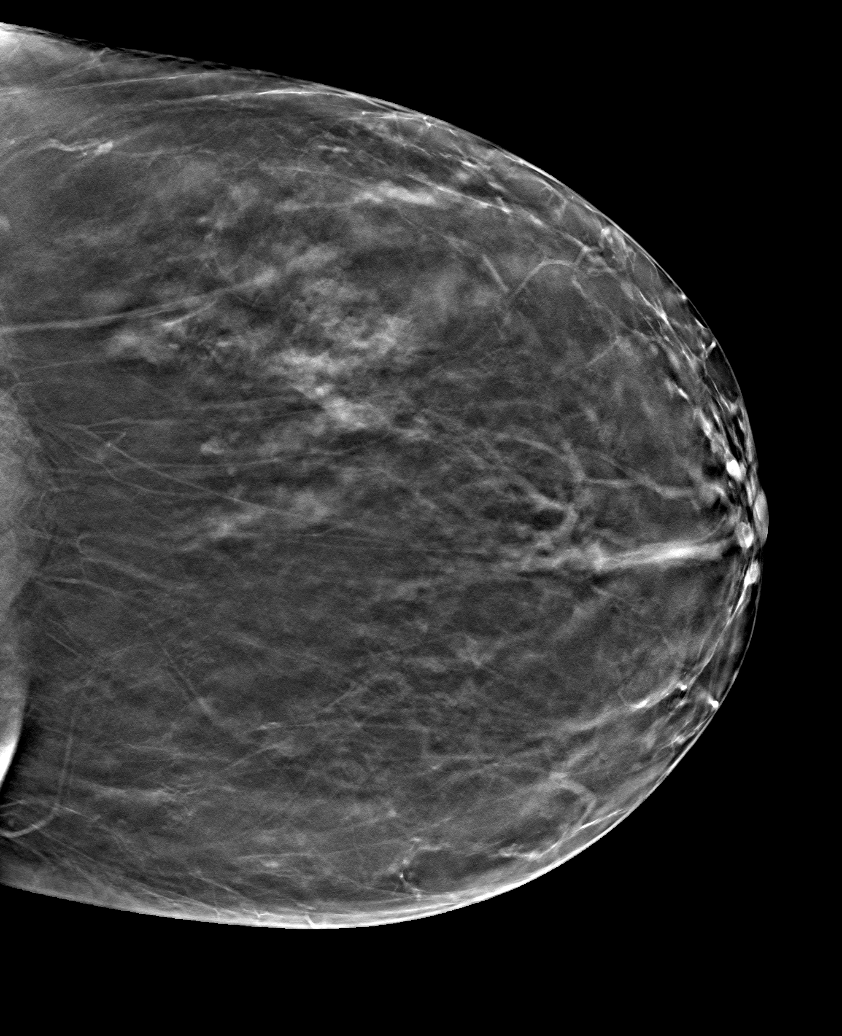
[frame 39/76]
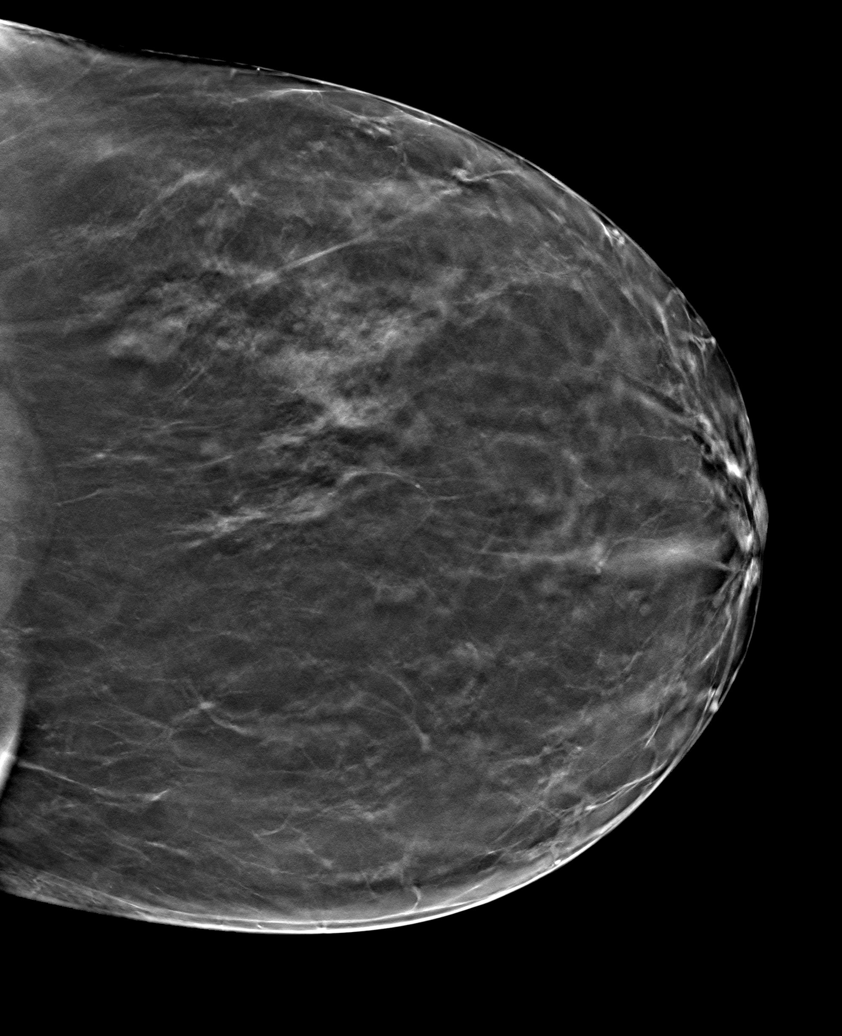
[frame 52/76]
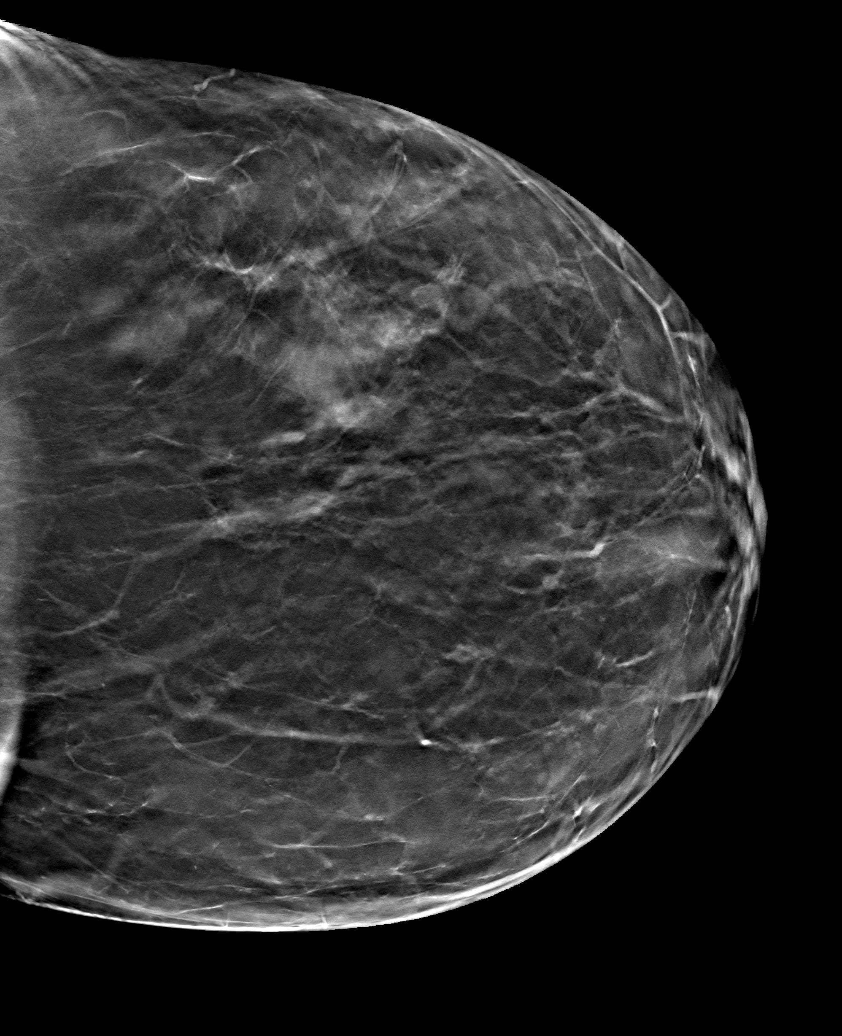

[L MLO tomo · tomo slice 41/82.0]
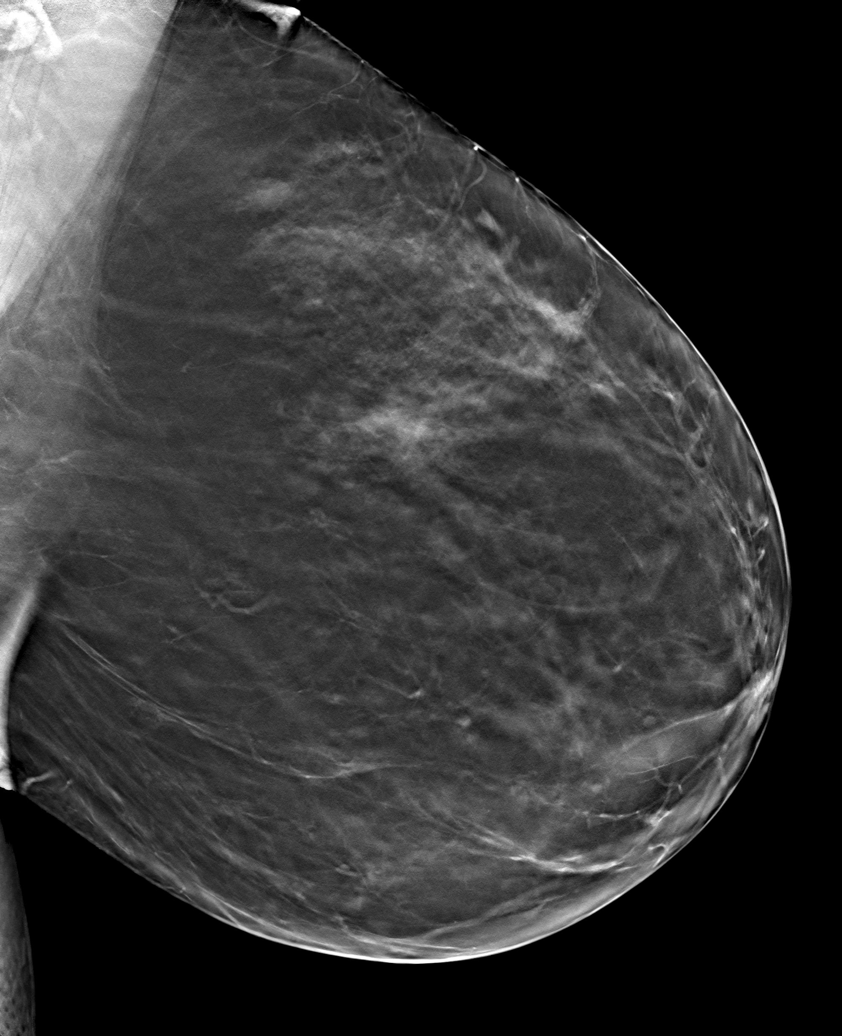

[6 of 12 positions shown; findings below may reference images not displayed]

ACR Breast Density Category b: There are scattered areas of
fibroglandular density.
FINDINGS: There is a duct ectasia in the retroareolar left breast, 6 o'clock
region. No suspicious mass is detected mammographically. No
suspicious microcalcification or distortion is seen on the
mammogram.

Mammographic images were processed with CAD.

On physical exam, I am unable to elicit nipple discharge with
attempted manual expression today. No mass is palpated in the
retroareolar left breast.

Targeted ultrasound is performed, showing dilated retroareolar ducts
in the 6 o'clock region of the left breast. Some of the dilated
ducts are fluid-filled. In the immediate subareolar 6 o'clock
region, there is a dilated duct that is filled with confluent
mass-like echogenicity. This intraductal echogenic mass extends
approximately 2.9 cm in the radial plane in the 6 o'clock position.
When turned antiradial, the intraductal filling defect is 0.4 x
cm, with a small amount anechoic fluid surrounding the periphery of
the mass. No definite color Doppler flow is seen within this
intraductal mass.

Ultrasound of the left axilla is negative.
IMPRESSION: Dilated retroareolar ducts in the 6 o'clock region. Elongated
echogenic mass within a portion of the immediate retroareolar
region, extending to the nipple measures up to 2.9 cm.
Considerations include intraductal papilloma (s), extensive
intraductal debris in a region of duct ectasia, or ductal carcinoma
in situ.

RECOMMENDATION:
Ultrasound-guided biopsy of the intraductal mass in the 6 o'clock
retroareolar left breast is recommended and is being scheduled for
the patient.

I have discussed the findings and recommendations with the patient.
Results were also provided in writing at the conclusion of the
visit. If applicable, a reminder letter will be sent to the patient
regarding the next appointment.

BI-RADS CATEGORY  4: Suspicious.

## 2017-01-17 IMAGING — US US BREAST BX W LOC DEV 1ST LESION IMG BX SPEC US GUIDE*L*
1 series · 13 of 16 positions shown · non-contrast
Comparison: Previous exam(s).

ADDENDUM:
Pathology results: Pathology results from the ultrasound-guided
biopsy of the intraductal mass in the left breast at 6 o'clock
retroareolar demonstrated papilloma. This is concordant with the
imaging findings. The patient has been notified of the results. She
is doing well and denies any biopsy site complications.

Ms. Wajiha Fontes, RN, from Dr. [REDACTED] has been notified of
the results. They would like us to make the surgical referral for
the patient. The patient states she will take some time to decide on
where to go for surgical consultation and will contact the [REDACTED] when she is ready to make this appointment.
The patient has been instructed to call the [REDACTED] with any
questions or concerns.
CLINICAL DATA: 54-year-old female with history of bloody left
nipple discharge and an intraductal mass in the left breast at 6
o'clock retroareolar.
EXAM:
ULTRASOUND GUIDED LEFT BREAST CORE NEEDLE BIOPSY

[Series 1: us breast bx w loc dev 1st lesion img bx spec us g · 0.07mm/px · 13 of 16 slices shown]
[im 1/16]
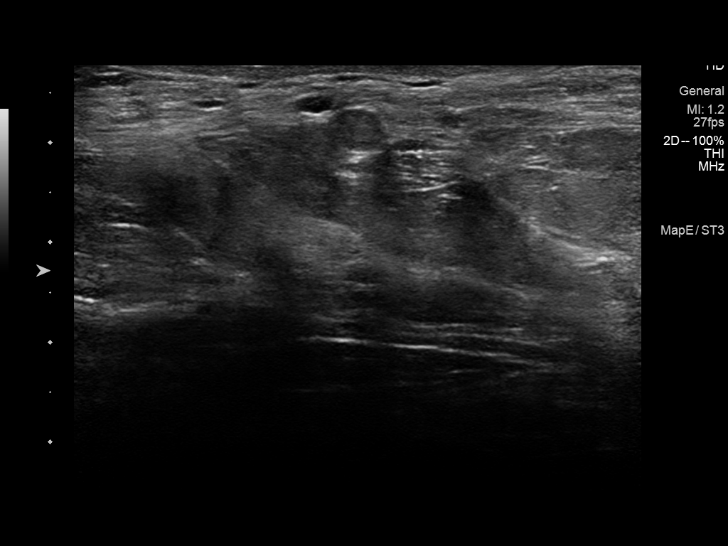
[im 2/16]
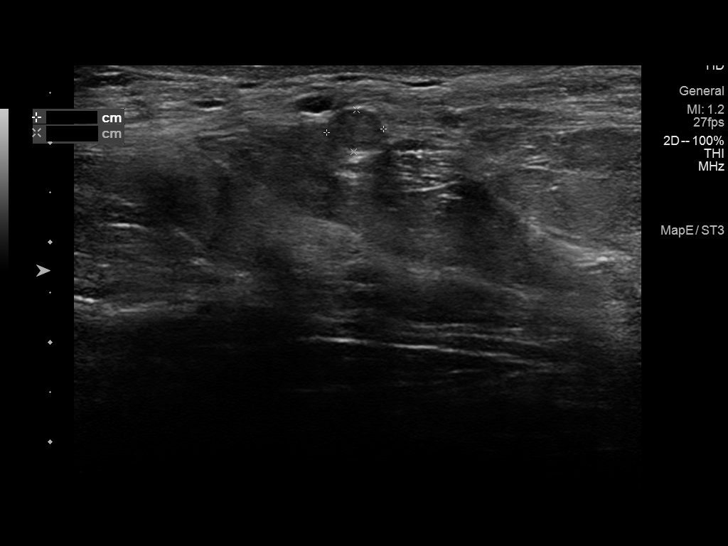
[im 4/16]
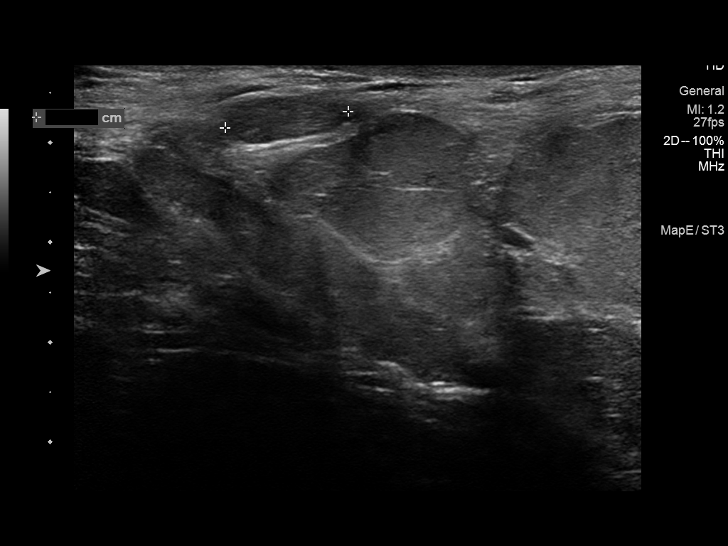
[im 5/16]
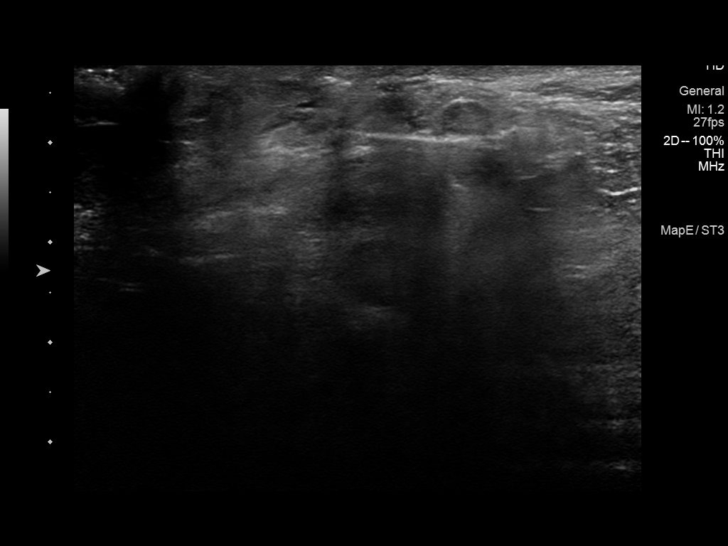
[im 6/16]
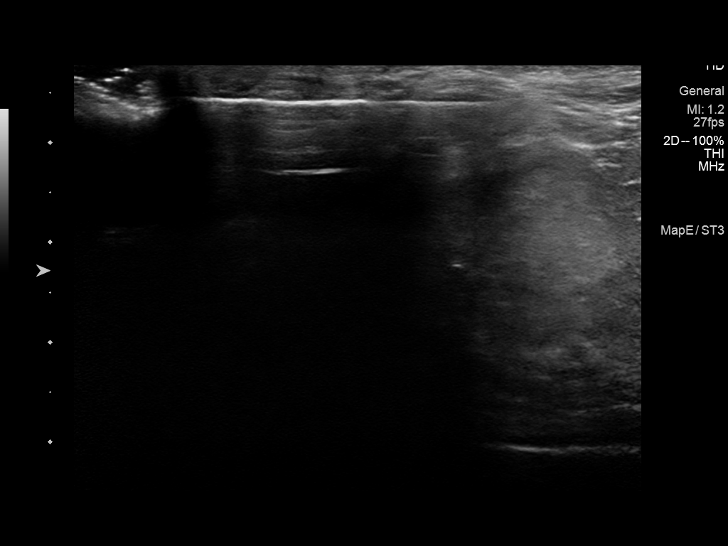
[im 7/16]
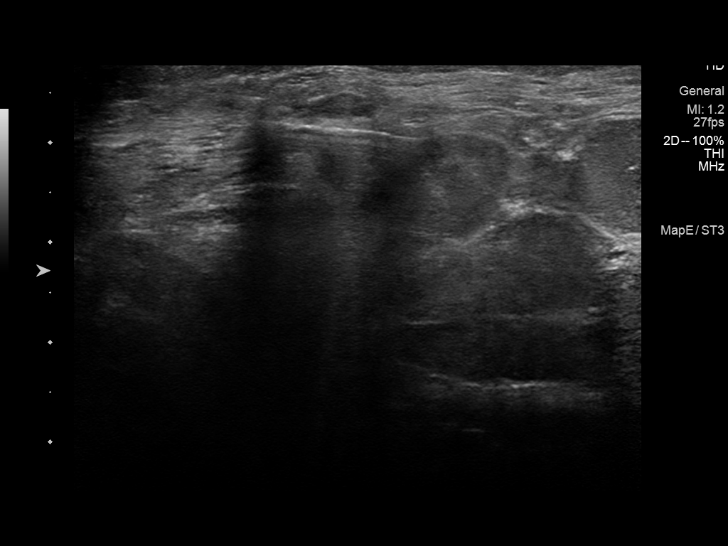
[im 9/16]
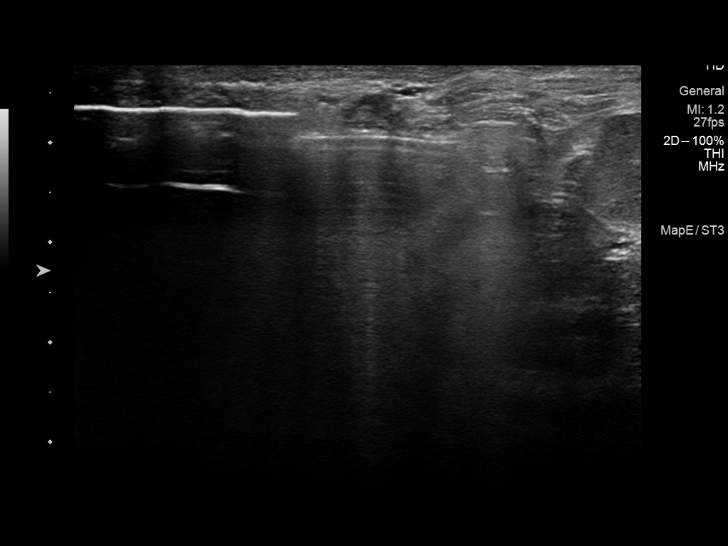
[im 10/16]
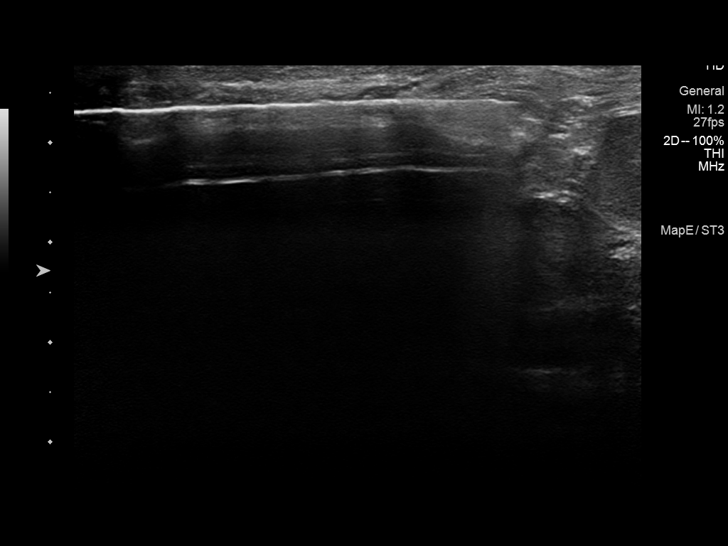
[im 11/16]
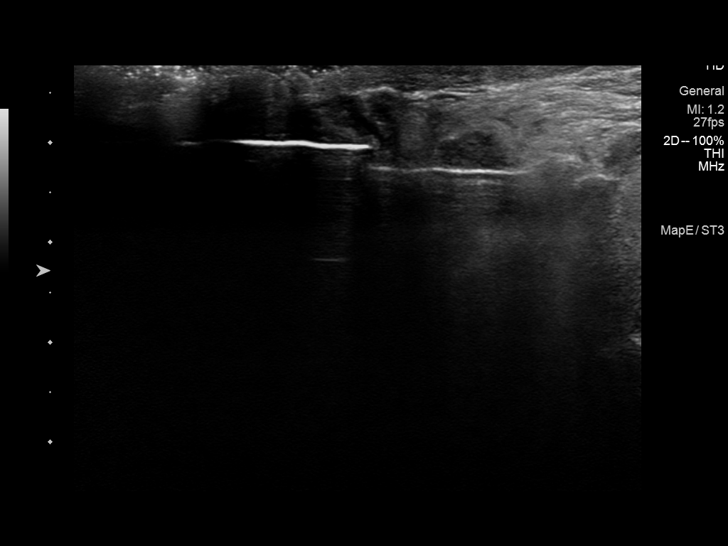
[im 12/16]
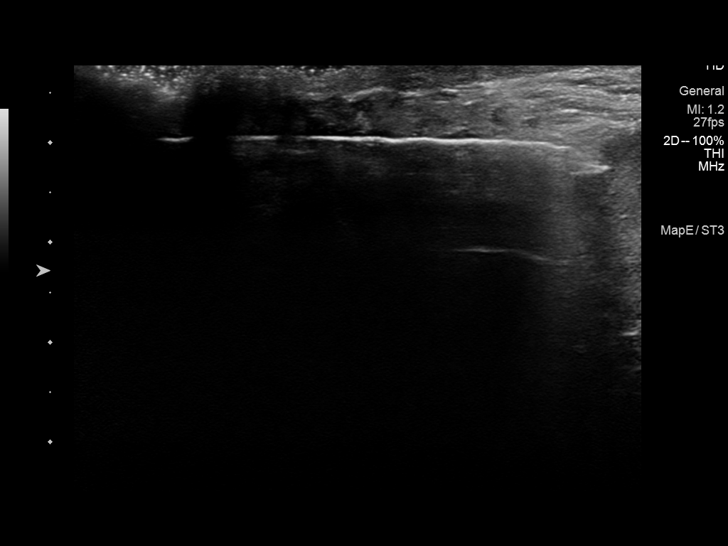
[im 13/16]
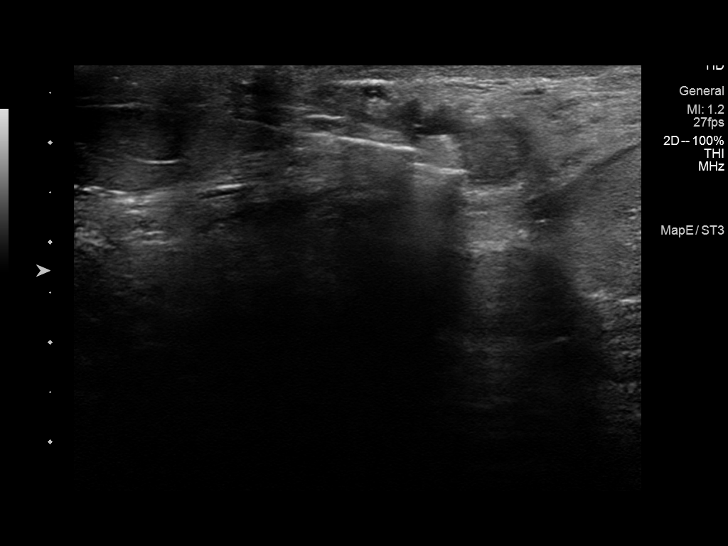
[im 15/16]
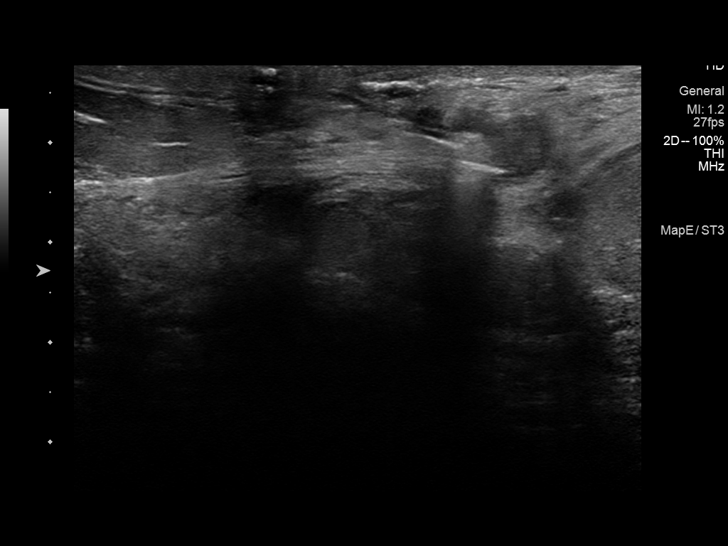
[im 16/16]
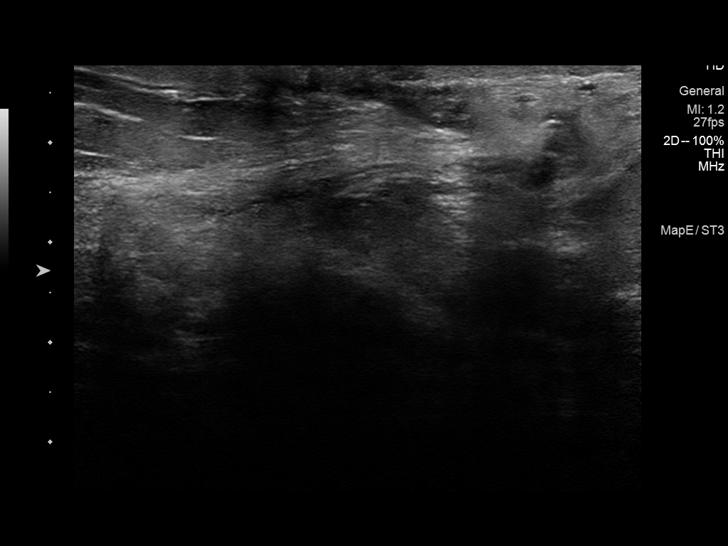

[13 of 16 positions shown; findings below may reference images not displayed]



Using sterile technique and 1% Lidocaine as local anesthetic, under
direct ultrasound visualization, a 14 gauge Simana device was
used to perform biopsy of the mass in the left breast at 6 o'clock
retroareolar using a medial to lateral approach. At the conclusion
of the procedure a ribbon shaped tissue marker clip was deployed
into the biopsy cavity. Follow up 2 view mammogram was performed and
dictated separately.
IMPRESSION: Ultrasound guided biopsy of the intraductal mass in the retroareolar
left breast at 6 o'clock. No apparent complications.

## 2017-03-07 IMAGING — US US BREAST*L* LIMITED INC AXILLA
1 series · 9 of 9 positions shown · non-contrast
Comparison: Previous exams, the most recent 04/04/2015

CLINICAL DATA: 54-year-old patient with recent bloody nipple
discharge on the left. The left nipple discharge occurred for
approximately 7 consecutive days, beginning as bloody, then becoming
yellowish. This discharge was approximately 3 weeks ago. Patient
states that she has never had nipple discharge prior to these
episodes recently. She does not palpate a lump.

EXAM:
DIGITAL DIAGNOSTIC LEFT MAMMOGRAM WITH CAD
ULTRASOUND LEFT BREAST

[Series 1: us breast*left* limited inc axilla · 0.07mm/px · 9 of 9 slices shown]
[im 1/9]
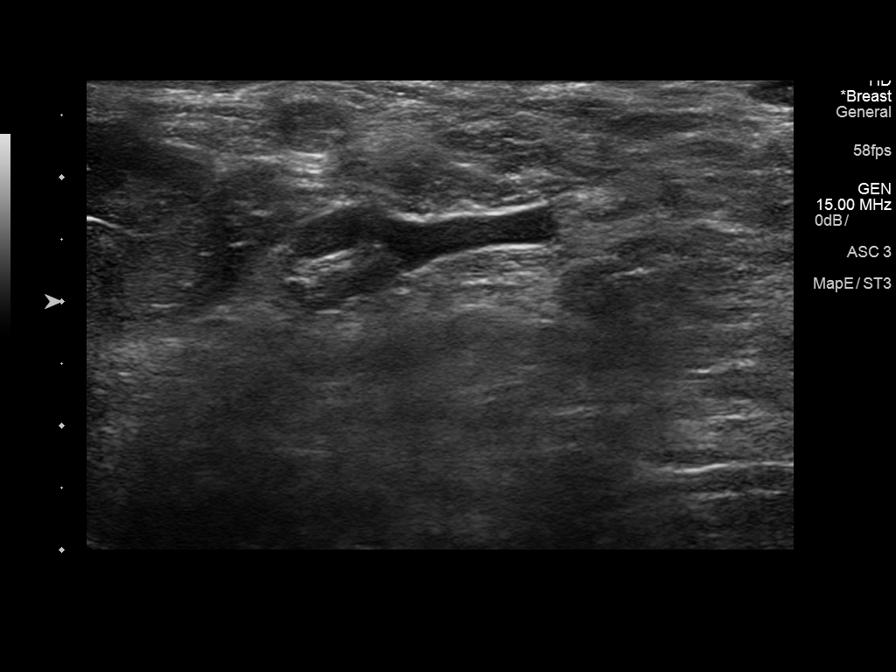
[im 2/9]
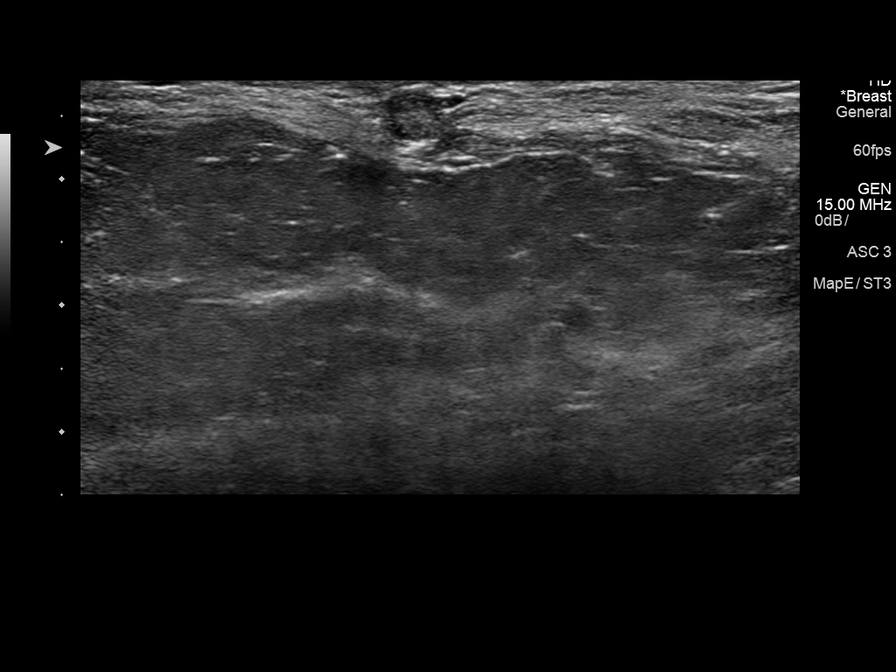
[im 3/9]
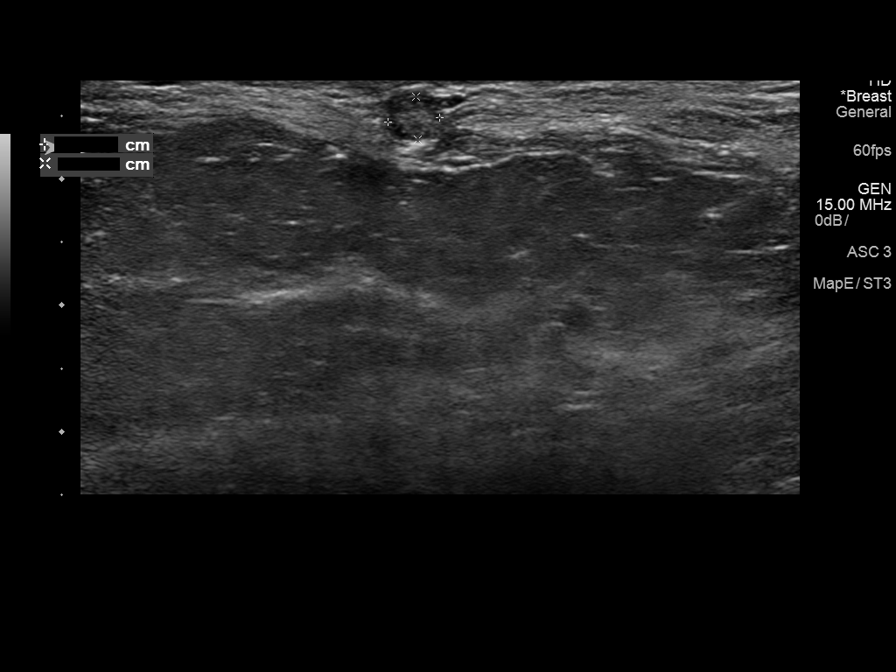
[im 4/9]
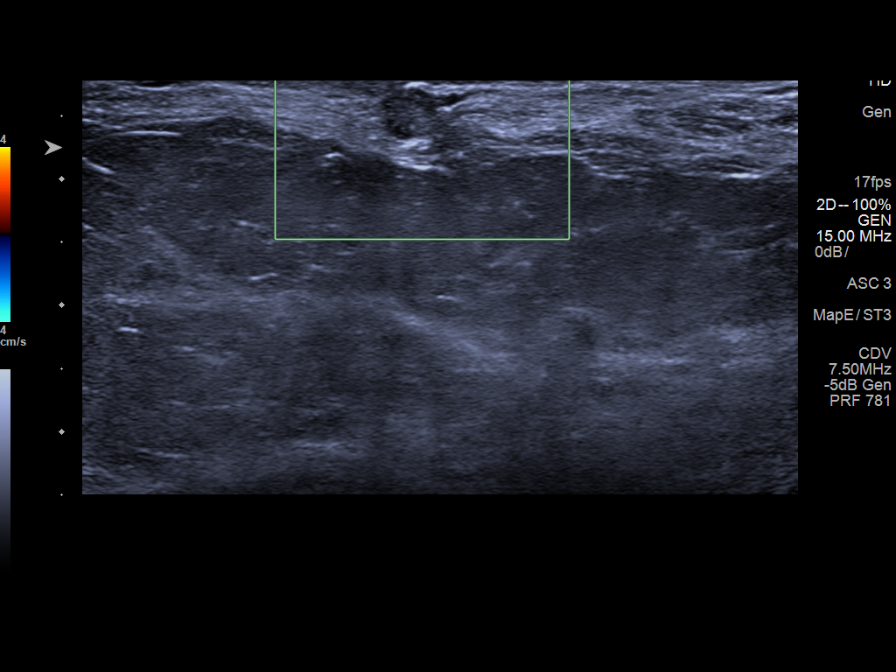
[im 5/9]
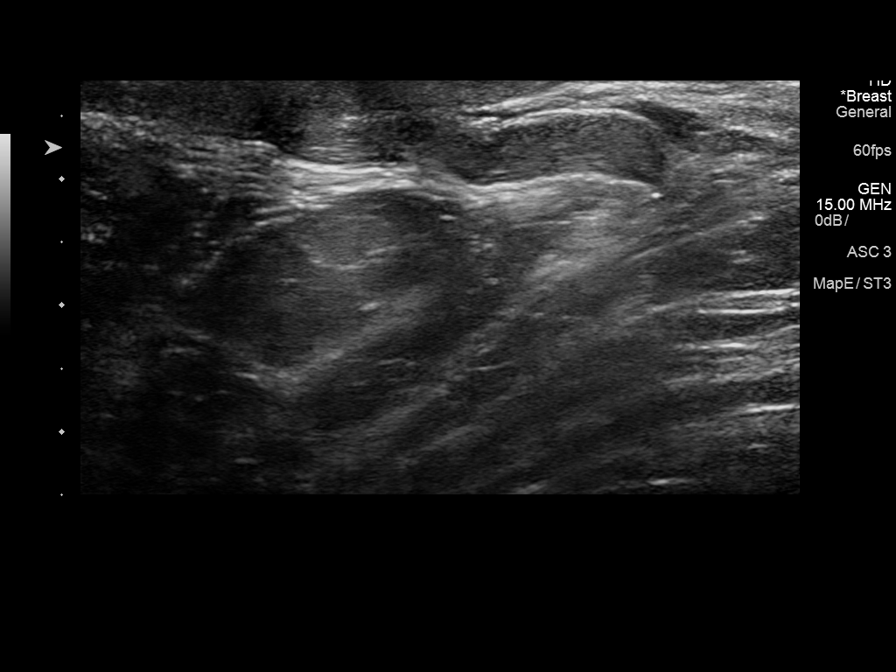
[im 6/9]
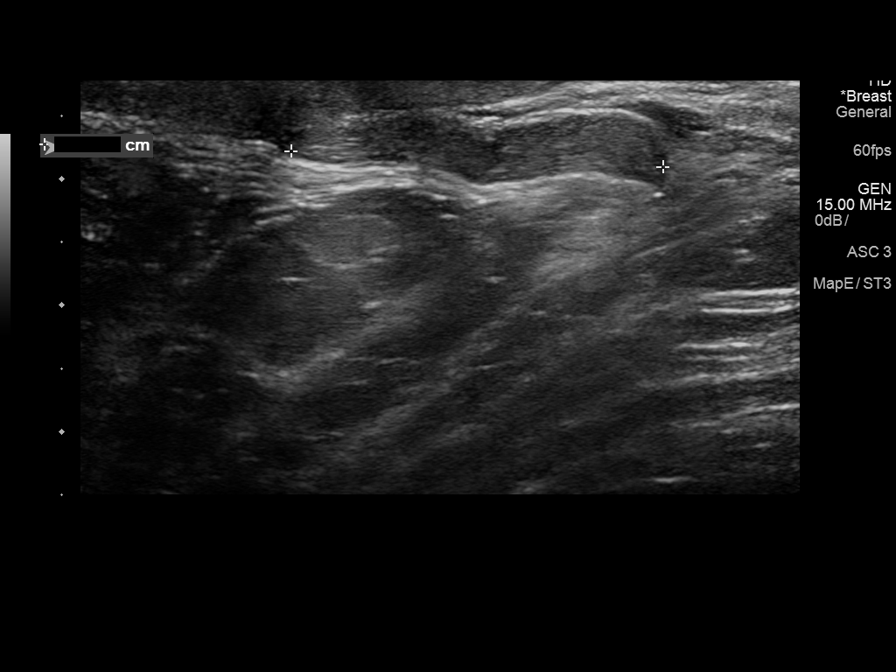
[im 7/9]
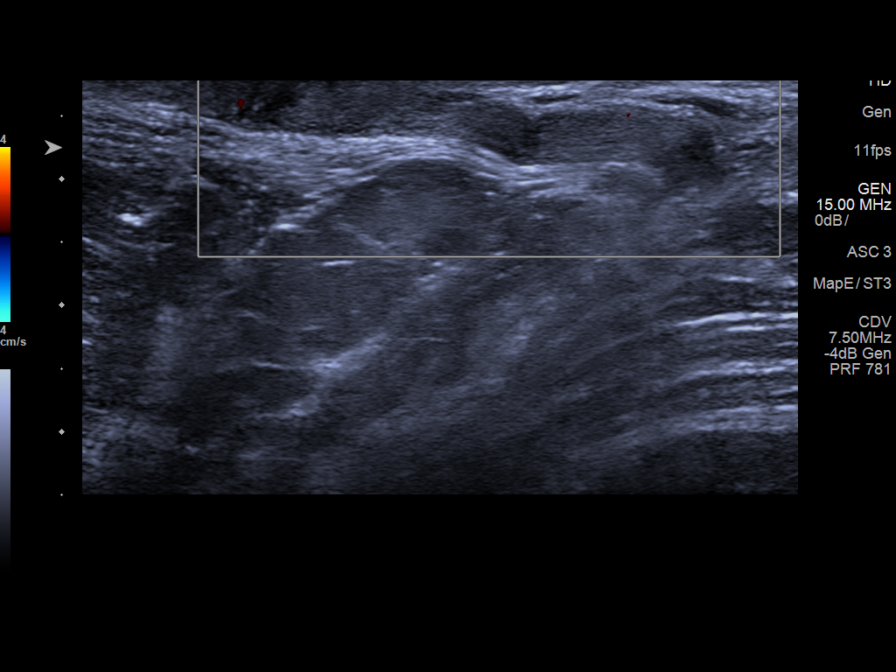
[im 8/9]
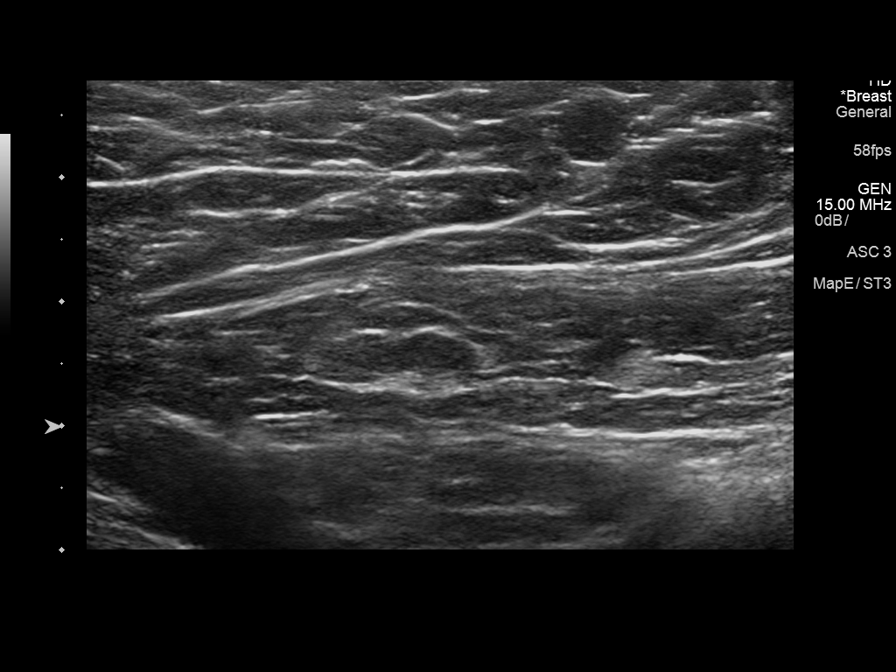
[im 9/9]
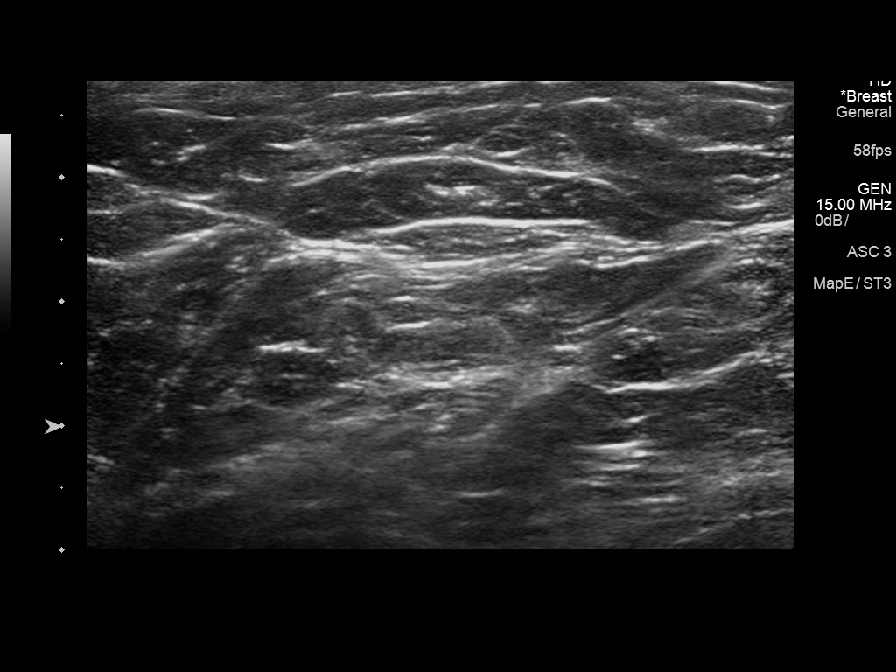

[9 of 9 positions shown; findings below may reference images not displayed]

ACR Breast Density Category b: There are scattered areas of
fibroglandular density.
FINDINGS: There is a duct ectasia in the retroareolar left breast, 6 o'clock
region. No suspicious mass is detected mammographically. No
suspicious microcalcification or distortion is seen on the
mammogram.

Mammographic images were processed with CAD.

On physical exam, I am unable to elicit nipple discharge with
attempted manual expression today. No mass is palpated in the
retroareolar left breast.

Targeted ultrasound is performed, showing dilated retroareolar ducts
in the 6 o'clock region of the left breast. Some of the dilated
ducts are fluid-filled. In the immediate subareolar 6 o'clock
region, there is a dilated duct that is filled with confluent
mass-like echogenicity. This intraductal echogenic mass extends
approximately 2.9 cm in the radial plane in the 6 o'clock position.
When turned antiradial, the intraductal filling defect is 0.4 x
cm, with a small amount anechoic fluid surrounding the periphery of
the mass. No definite color Doppler flow is seen within this
intraductal mass.

Ultrasound of the left axilla is negative.
IMPRESSION: Dilated retroareolar ducts in the 6 o'clock region. Elongated
echogenic mass within a portion of the immediate retroareolar
region, extending to the nipple measures up to 2.9 cm.
Considerations include intraductal papilloma (s), extensive
intraductal debris in a region of duct ectasia, or ductal carcinoma
in situ.

RECOMMENDATION:
Ultrasound-guided biopsy of the intraductal mass in the 6 o'clock
retroareolar left breast is recommended and is being scheduled for
the patient.

I have discussed the findings and recommendations with the patient.
Results were also provided in writing at the conclusion of the
visit. If applicable, a reminder letter will be sent to the patient
regarding the next appointment.

BI-RADS CATEGORY  4: Suspicious.

## 2017-09-29 DIAGNOSIS — E663 Overweight: Secondary | ICD-10-CM | POA: Diagnosis not present

## 2017-09-29 DIAGNOSIS — Z6827 Body mass index (BMI) 27.0-27.9, adult: Secondary | ICD-10-CM | POA: Diagnosis not present

## 2017-09-29 DIAGNOSIS — S60559A Superficial foreign body of unspecified hand, initial encounter: Secondary | ICD-10-CM | POA: Diagnosis not present

## 2018-01-12 DIAGNOSIS — J069 Acute upper respiratory infection, unspecified: Secondary | ICD-10-CM | POA: Diagnosis not present

## 2018-01-12 DIAGNOSIS — Z6828 Body mass index (BMI) 28.0-28.9, adult: Secondary | ICD-10-CM | POA: Diagnosis not present

## 2018-01-12 DIAGNOSIS — J343 Hypertrophy of nasal turbinates: Secondary | ICD-10-CM | POA: Diagnosis not present

## 2018-01-12 DIAGNOSIS — R05 Cough: Secondary | ICD-10-CM | POA: Diagnosis not present

## 2019-02-17 ENCOUNTER — Other Ambulatory Visit (HOSPITAL_COMMUNITY): Payer: Self-pay | Admitting: Physician Assistant

## 2019-02-17 DIAGNOSIS — Z1231 Encounter for screening mammogram for malignant neoplasm of breast: Secondary | ICD-10-CM

## 2020-10-22 ENCOUNTER — Telehealth: Payer: BLUE CROSS/BLUE SHIELD | Admitting: Physician Assistant

## 2020-10-22 DIAGNOSIS — U071 COVID-19: Secondary | ICD-10-CM

## 2020-10-22 MED ORDER — FLUTICASONE PROPIONATE 50 MCG/ACT NA SUSP: 2.0000 | Freq: Every day | NASAL | 0 refills | Status: DC

## 2020-10-22 MED ORDER — ALBUTEROL SULFATE HFA 108 (90 BASE) MCG/ACT IN AERS
2.0000 | INHALATION_SPRAY | Freq: Four times a day (QID) | RESPIRATORY_TRACT | 0 refills | Status: DC | PRN
Start: 2020-10-22 — End: 2021-07-03

## 2020-10-22 MED ORDER — MOLNUPIRAVIR EUA 200MG CAPSULE: 4.0000 | ORAL_CAPSULE | Freq: Two times a day (BID) | ORAL | 0 refills | Status: AC

## 2020-10-22 MED ORDER — BENZONATATE 100 MG PO CAPS: 100.0000 mg | ORAL_CAPSULE | Freq: Three times a day (TID) | ORAL | 0 refills | Status: DC | PRN

## 2020-10-22 NOTE — Progress Notes (Signed)
Virtual Visit Consent   Kari Fry, you are scheduled for a virtual visit with a [facility] provider today.     Just as with appointments in the office, your consent must be obtained to participate.  Your consent will be active for this visit and any virtual visit you may have with one of our providers in the next 365 days.     If you have a MyChart account, a copy of this consent can be sent to you electronically.  All virtual visits are billed to your insurance company just like a traditional visit in the office.    As this is a virtual visit, video technology does not allow for your provider to perform a traditional examination.  This may limit your provider's ability to fully assess your condition.  If your provider identifies any concerns that need to be evaluated in person or the need to arrange testing (such as labs, EKG, etc.), we will make arrangements to do so.     Although advances in technology are sophisticated, we cannot ensure that it will always work on either your end or our end.  If the connection with a video visit is poor, the visit may have to be switched to a telephone visit.  With either a video or telephone visit, we are not always able to ensure that we have a secure connection.     I need to obtain your verbal consent now.   Are you willing to proceed with your visit today?    Kari Fry has provided verbal consent on 10/22/2020 for a virtual visit (video or telephone).   Mar Daring, PA-C   Date: 10/22/2020 7:47 AM   Virtual Visit via Video Note   I, Mar Daring, connected with  Kari Fry  ([id number], Fry 28, 1962) on 10/22/20 at  7:30 AM EDT by a video-enabled telemedicine application and verified that I am speaking with the correct person using two identifiers.  Location: Patient: Virtual Visit Location Patient: Home Provider: Virtual Visit Location Provider: Home Office   I discussed the limitations of evaluation and management by  telemedicine and the availability of in person appointments. The patient expressed understanding and agreed to proceed.    History of Present Illness: Kari Fry is a 60 y.o. who identifies as a female who was assigned female at birth, and is being seen today for Covid 75.  HPI: URI  This is a new problem. Episode onset: tested positive for Covid 19 yesterday, symptoms Saturday. The problem has been gradually worsening. The maximum temperature recorded prior to her arrival was 100.4 - 100.9 F. The fever has been present for Less than 1 day. Associated symptoms include chest pain (tightness), congestion, coughing, headaches, rhinorrhea, sinus pain and a sore throat. Pertinent negatives include no diarrhea, ear pain, nausea, plugged ear sensation, vomiting or wheezing. Associated symptoms comments: Fatigue, body aches, chills, back pain. Treatments tried: ibuprofen. The treatment provided mild relief.     Problems:  Patient Active Problem List   Diagnosis Date Noted   Right ankle sprain 03/04/2011   Posterior tibial tendonitis 03/04/2011   NUMBNESS, HAND 06/27/2009   H N P-CERVICAL 06/26/2009   CERVICAL RADICULITIS 06/26/2009   CERVICAL SPASM 06/26/2009    Allergies:  Allergies  Allergen Reactions   Fexofenadine    Sulfonamide Derivatives    Medications:  Current Outpatient Medications:    albuterol (VENTOLIN HFA) 108 (90 Base) MCG/ACT inhaler, Inhale 2 puffs into the lungs every  6 (six) hours as needed for wheezing or shortness of breath., Disp: 8 g, Rfl: 0   benzonatate (TESSALON) 100 MG capsule, Take 1 capsule (100 mg total) by mouth 3 (three) times daily as needed., Disp: 30 capsule, Rfl: 0   fluticasone (FLONASE) 50 MCG/ACT nasal spray, Place 2 sprays into both nostrils daily., Disp: 16 g, Rfl: 0   molnupiravir EUA (LAGEVRIO) 200 mg CAPS capsule, Take 4 capsules (800 mg total) by mouth 2 (two) times daily for 5 days., Disp: 40 capsule, Rfl: 0   acetaminophen (TYLENOL) 500 MG  tablet, Take 1,000 mg by mouth every 6 (six) hours as needed. Pain, Disp: , Rfl:    cetirizine (ZYRTEC) 10 MG tablet, Take 10 mg by mouth daily., Disp: , Rfl:    estrogens conjugated, synthetic B, (ENJUVIA) 0.3 MG tablet, Take 0.3 mg by mouth daily., Disp: , Rfl:    HYDROcodone-acetaminophen (NORCO/VICODIN) 5-325 MG per tablet, Take 1 tablet by mouth every 4 (four) hours as needed for moderate pain., Disp: 20 tablet, Rfl: 0   traMADol-acetaminophen (ULTRACET) 37.5-325 MG per tablet, Take 1 tablet by mouth 3 (three) times daily as needed. pain, Disp: , Rfl:   Observations/Objective: Patient is well-developed, well-nourished in no acute distress.  Resting comfortably at home.  Head is normocephalic, atraumatic.  No labored breathing.  Speech is clear and coherent with logical content.  Patient is alert and oriented at baseline.  Small, dry cough heard x 1   Assessment and Plan: 1. COVID-19 - benzonatate (TESSALON) 100 MG capsule; Take 1 capsule (100 mg total) by mouth 3 (three) times daily as needed.  Dispense: 30 capsule; Refill: 0 - fluticasone (FLONASE) 50 MCG/ACT nasal spray; Place 2 sprays into both nostrils daily.  Dispense: 16 g; Refill: 0 - albuterol (VENTOLIN HFA) 108 (90 Base) MCG/ACT inhaler; Inhale 2 puffs into the lungs every 6 (six) hours as needed for wheezing or shortness of breath.  Dispense: 8 g; Refill: 0 - molnupiravir EUA (LAGEVRIO) 200 mg CAPS capsule; Take 4 capsules (800 mg total) by mouth 2 (two) times daily for 5 days.  Dispense: 40 capsule; Refill: 0 - MyChart COVID-19 home monitoring program; Future   - Continue OTC symptomatic management of choice - Will send OTC vitamins and supplement information through AVS - Molnupiravir prescribed - Tessalon perles for cough, flonase for nasal congestion, and albuterol for chest tightness - Patient enrolled in MyChart symptom monitoring - Push fluids - Rest as needed - Discussed return precautions and when to seek  in-person evaluation, sent via AVS as well   Follow Up Instructions: I discussed the assessment and treatment plan with the patient. The patient was provided an opportunity to ask questions and all were answered. The patient agreed with the plan and demonstrated an understanding of the instructions.  A copy of instructions were sent to the patient via MyChart.  The patient was advised to call back or seek an in-person evaluation if the symptoms worsen or if the condition fails to improve as anticipated.  Time:  I spent 17 minutes with the patient via telehealth technology discussing the above problems/concerns.    Mar Daring, PA-C

## 2020-10-22 NOTE — Patient Instructions (Signed)
Hello Kari Fry,  Kari Fry are being placed in the home monitoring program for COVID-19 (commonly known as Coronavirus).  This is because Kari Fry are suspected to have the virus or are known to have the virus.  If Kari Fry are unsure which group Kari Fry fall into call your clinic.    As part of this program, Kari Fry'll answer a daily questionnaire in the MyChart mobile app. Kari Fry'll receive a notification through the MyChart app when the questionnaire is available. When Kari Fry log in to MyChart, Kari Fry'll see the tasks in your To Do activity.       Clinicians will see any answers that are concerning and take appropriate steps.  If at any point Kari Fry are having a medical emergency, call 911.  If otherwise concerned call your clinic instead of coming into the clinic or hospital.  To keep from spreading the disease Kari Fry should: Stay home and limit contact with other people as much as possible.  Wash your hands frequently. Cover your coughs and sneezes with a tissue, and throw used tissues in the trash.   Clean and disinfect frequently touched surfaces and objects.    Take care of yourself by: Staying home Resting Drinking fluids Take fever-reducing medications (Tylenol/Acetaminophen and Ibuprofen)  For more information on the disease go to the Centers for Disease Control and Prevention website     Kari Fry are being prescribed Kari Fry for COVID-19 infection.   Please call the pharmacy or go through the drive through vs going inside if Kari Fry are picking up the mediation yourself to prevent further spread. If prescribed to a [facility] affiliated pharmacy, a pharmacist will bring the medication out to your car.   ADMINISTRATION INSTRUCTIONS: Take with or without food. Swallow the tablets whole. Don't chew, crush, or break the medications because it might not work as well  For each dose of the medication, Kari Fry should be taking FOUR tablets at one time, TWICE a day   Finish your full five-day course of Kari Fry even if  Kari Fry feel better before Kari Fry're done. Stopping this medication too early can make it less effective to prevent severe illness related to [place].    Kari Fry is prescribed for Kari Fry ONLY. Don't share it with others, even if they have similar symptoms as Kari Fry. This medication might not be right for everyone.   Make sure to take steps to protect yourself and others while Kari Fry're taking this medication in order to get well soon and to prevent others from getting sick with COVID-19.   **If Kari Fry are of childbearing potential (any gender) - it is advised to not get pregnant while taking this medication and recommended that condoms are used for female partners the next 3 months after taking the medication out of extreme caution    COMMON SIDE EFFECTS: Diarrhea Nausea  Dizziness    If your COVID-19 symptoms get worse, get medical help right away. Call 911 if Kari Fry experience symptoms such as worsening cough, trouble breathing, chest pain that doesn't go away, confusion, a hard time staying awake, and pale or blue-colored skin. This medication won't prevent all COVID-19 cases from getting worse.  Can take to lessen severity: Vit C '500mg'$  twice daily Quercertin 250-'500mg'$  twice daily Zinc 75-'100mg'$  daily Melatonin 3-6 mg at bedtime Vit D3 1000-2000 IU daily Aspirin 81 mg daily with food Optional: Famotidine '20mg'$  daily Also can add tylenol/ibuprofen as needed for fevers and body aches May add Mucinex or Mucinex DM as needed for cough/congestion  10 Things Kari Fry Can Do to  Manage Your COVID-19 Symptoms at Home If Kari Fry have possible or confirmed COVID-19 Stay home except to get medical care. Monitor your symptoms carefully. If your symptoms get worse, call your healthcare provider immediately. Get rest and stay hydrated. If Kari Fry have a medical appointment, call the healthcare provider ahead of time and tell them that Kari Fry have or may have COVID-19. For medical emergencies, call 911 and notify the dispatch  personnel that Kari Fry have or may have COVID-19. Cover your cough and sneezes with a tissue or use the inside of your elbow. Wash your hands often with soap and water for at least 20 seconds or clean your hands with an alcohol-based hand sanitizer that contains at least 60% alcohol. As much as possible, stay in a specific room and away from other people in your home. Also, Kari Fry should use a separate bathroom, if available. If Kari Fry need to be around other people in or outside of the home, wear a mask. Avoid sharing personal items with other people in your household, like dishes, towels, and bedding. Clean all surfaces that are touched often, like counters, tabletops, and doorknobs. Use household cleaning sprays or wipes according to the label instructions. [email or web address] 08/19/2019 This information is not intended to replace advice given to Kari Fry by your health care provider. Make sure Kari Fry discuss any questions Kari Fry have with your health care provider. Document Revised: 06/07/2020 Document Reviewed: 06/07/2020 Elsevier Patient Education  Home.

## 2021-07-02 ENCOUNTER — Other Ambulatory Visit (HOSPITAL_COMMUNITY): Payer: Self-pay | Admitting: Family Medicine

## 2021-07-02 DIAGNOSIS — Z1231 Encounter for screening mammogram for malignant neoplasm of breast: Secondary | ICD-10-CM

## 2021-07-03 ENCOUNTER — Telehealth: Payer: Self-pay

## 2021-07-03 ENCOUNTER — Ambulatory Visit (INDEPENDENT_AMBULATORY_CARE_PROVIDER_SITE_OTHER): Payer: 59 | Admitting: Internal Medicine

## 2021-07-03 ENCOUNTER — Encounter: Payer: Self-pay | Admitting: Internal Medicine

## 2021-07-03 VITALS — BP 120/80 | HR 67 | Ht 62.5 in | Wt 169.2 lb

## 2021-07-03 DIAGNOSIS — R6889 Other general symptoms and signs: Secondary | ICD-10-CM | POA: Diagnosis not present

## 2021-07-03 NOTE — Patient Instructions (Signed)
Medication Instructions:  No changes *If you need a refill on your cardiac medications before your next appointment, please call your pharmacy*   Lab Work: None ordered If you have labs (blood work) drawn today and your tests are completely normal, you will receive your results only by: [place] (if you have MyChart) OR A paper copy in the mail If you have any lab test that is abnormal or we need to change your treatment, we will call you to review the results.   Testing/Procedures: Dr. Harl Bowie has ordered an echo stress test. This will be done at the [facility] office   Follow-Up: At [facility], you and your health needs are our priority.  As part of our continuing mission to provide you with exceptional heart care, we have created designated Provider Care Teams.  These Care Teams include your primary Cardiologist (physician) and Advanced Practice Providers (APPs -  Physician Assistants and Nurse Practitioners) who all work together to provide you with the care you need, when you need it.  We recommend signing up for the patient portal called "MyChart".  Sign up information is provided on this After Visit Summary.  MyChart is used to connect with patients for Virtual Visits (Telemedicine).  Patients are able to view lab/test results, encounter notes, upcoming appointments, etc.  Non-urgent messages can be sent to your provider as well.   To learn more about what you can do with MyChart, go to [email or web address].    Your next appointment:   Follow up as needed with Dr. Harl Bowie  Exercise Stress Echocardiogram An exercise stress echocardiogram is a test to check how well your heart is working. This test uses sound waves (ultrasound) and a computer to make images of your heart before and after exercise. Ultrasound images that are taken before you exercise (resting echocardiogram) will show how much blood is getting to your heart muscle and how well your heart muscle and heart  valves are functioning. During the next part of this test, you will walk on a treadmill or ride a stationary bicycle to see how exercise affects your heart. While you exercise, the electrical activity of your heart will be monitored with an electrocardiogram (ECG). Your blood pressure will also be monitored. You may have this test if you have: Chest pain or other symptoms of a heart problem. Recently had a heart attack or heart surgery. Heart valve problems. A condition that causes narrowing of the blood vessels that supply your heart (coronary artery disease). A high risk of heart disease and are starting a new exercise program. A high risk of heart disease and need to have major surgery. Heart arrhythmia (abnormal heart rhythm) problems. Heart failure problems. Tell a health care provider about: Any allergies you have. All medicines you are taking, including vitamins, herbs, eye drops, creams, and over-the-counter medicines. Any problems you or family members have had with anesthetic medicines. Any surgeries you have had. Any blood disorders you have. Any medical conditions you have. Whether you are pregnant or may be pregnant. What are the risks? Generally, this is a safe test. However, problems may occur, including: Chest pain. Dizziness or light-headedness. Shortness of breath. Increased or irregular heartbeat (palpitations). Nausea or vomiting. Heart attack. This is very rare. What happens before the test? Medicines Ask your health care provider about changing or stopping your regular medicines. This is especially important if you are taking diabetes medicines or blood thinners. If you use an inhaler, bring it with you to the  test. General instructions Wear loose, comfortable clothing and walking shoes. Follow instructions from your health care provider about eating or drinking restrictions. You may be asked to avoid all forms of caffeine for 24 hours before your test, or as  told by your health care provider. Do not use any products that contain nicotine or tobacco for 4 hours before the test, or as told by your health care provider. These products include cigarettes, chewing tobacco, and vaping devices, such as e-cigarettes. If you need help quitting, ask your health care provider. What happens during the test?  You will take off your clothes from the waist up and put on a hospital gown. Electrodes or electrocardiogram (ECG) patches may be placed on your chest. The electrodes or patches are then connected to a device that monitors your heart rate and rhythm. A blood pressure cuff will be placed on your arm. You will lie down on a table for an ultrasound exam before you exercise. A gel will be applied to your chest to help sound waves pass through your skin. A handheld device, called a transducer, will be pressed against your chest and moved over your heart. The transducer produces sound waves that travel to your heart and bounce back (or "echo" back) to the transducer. These sound waves will be captured in real-time and changed into images of your heart that can be viewed on a video monitor. The images will be recorded on a computer and reviewed by your health care provider. Once the ultrasound is complete, you will start exercising by walking on a treadmill or pedaling a stationary bicycle. Your blood pressure and heart rhythm will be monitored while you exercise. The exercise will gradually get harder or faster. You will exercise until: Your heart reaches a target level. You are too tired to continue. You cannot continue because of chest pain, weakness, or dizziness. You will have another ultrasound exam immediately after you stop exercising. The procedure may vary among health care providers and hospitals. What can I expect after the test? After your test, it is common to have: Mild soreness. Mild fatigue. Your heart rate and blood pressure will be monitored  until they return to your normal levels. You should not have any new symptoms after this test. Follow these instructions at home: After your stress test, you should be able to return to your normal activities and diet. Take over-the-counter and prescription medicines only as told by your health care provider. Keep all follow-up visits. This is important. It is up to you to get the results of your test. Ask your health care provider, or the department that is doing the test, when your results will be ready. Contact a health care provider if you: Feel dizzy or light-headed. Have a fast or irregular heartbeat. Have nausea or vomiting. Have a headache. Feel short of breath. Get help right away if you: Develop pain or pressure: In your chest. In your jaw or neck. Between your shoulder blades. Radiating down your left arm. Faint. Have trouble breathing. These symptoms may represent a serious problem that is an emergency. Do not wait to see if the symptoms will go away. Get medical help right away. Call your local emergency services (911 in the [place] Do not drive yourself to the hospital. Summary An exercise stress echocardiogram is a test that uses ultrasound to check how well your heart works before and after exercise. Before the test, follow instructions from your health care provider about stopping medicines and avoiding  caffeine, nicotine and tobacco, and certain foods and drinks. During the test, your blood pressure and heart rhythm will be monitored while you exercise on a treadmill or stationary bicycle. This information is not intended to replace advice given to you by your health care provider. Make sure you discuss any questions you have with your health care provider. Document Revised: 10/03/2020 Document Reviewed: 09/13/2019 Elsevier Patient Education  [place].

## 2021-07-03 NOTE — Progress Notes (Signed)
Cardiology Office Note:    Date:  07/03/2021   ID:  Kari Fry, DOB [birth year], MRN [id number]  PCP:  Redmond School, MD   [place] Providers Cardiologist:  None     Referring MD: Sharilyn Sites, MD   No chief complaint on file. Exercise intolerance  History of Present Illness:    Kari Fry is a 61 y.o. female with no significant pmhx , no cardiac hx. She reports increased exercise intolerance. She mows the lawn and then notes that she cannot continue very long without having to stop and rest.  She has no chest pressure. No nausea or diaphoresis. No significant DOE. No signs of CHF. No premature family hx of CAD.  No HTN hx. She notes prior hx of preDM but this has resolved.  Past Medical History:  Diagnosis Date   Bulging discs     No past surgical history on file.  Current Medications: Current Meds  Medication Sig   acetaminophen (TYLENOL) 500 MG tablet Take 1,000 mg by mouth every 6 (six) hours as needed. Pain     Allergies:   Amoxicillin, Fexofenadine, Sulfa antibiotics, and Sulfonamide derivatives   Social History   Socioeconomic History   Marital status: Divorced    Spouse name: Not on file   Number of children: Not on file   Years of education: Not on file   Highest education level: Not on file  Occupational History   Not on file  Tobacco Use   Smoking status: Never   Smokeless tobacco: Not on file  Substance and Sexual Activity   Alcohol use: No   Drug use: No   Sexual activity: Not on file  Other Topics Concern   Not on file  Social History Narrative   Not on file   Social Determinants of Health   Financial Resource Strain: Not on file  Food Insecurity: Not on file  Transportation Needs: Not on file  Physical Activity: Not on file  Stress: Not on file  Social Connections: Not on file     Family History: No premature CAD  ROS:   Please see the history of present illness.     All other systems reviewed and are  negative.  EKGs/Labs/Other Studies Reviewed:    The following studies were reviewed today:   EKG:  EKG is  ordered today.  The ekg ordered today demonstrates  NSR with sinus arrhythmia  Recent Labs: No results found for requested labs within last 8760 hours.   Recent Lipid Panel No results found for: CHOL, TRIG, HDL, CHOLHDL, VLDL, LDLCALC, LDLDIRECT   Risk Assessment/Calculations:           Physical Exam:    VS:    Vitals:   07/03/21 1132  BP: 120/80  Pulse: 67  SpO2: 98%     Wt Readings from Last 3 Encounters:  07/03/21 169 lb 3.2 oz (76.7 kg)  12/22/12 173 lb (78.5 kg)  04/16/11 166 lb (75.3 kg)     GEN:  Well nourished, well developed in no acute distress HEENT: Normal NECK: No JVD; No carotid bruits LYMPHATICS: No lymphadenopathy CARDIAC: RRR, no murmurs, rubs, gallops RESPIRATORY:  Clear to auscultation without rales, wheezing or rhonchi  ABDOMEN: Soft, non-tender, non-distended MUSCULOSKELETAL:  No edema; No deformity  SKIN: Warm and dry NEUROLOGIC:  Alert and oriented x 3 PSYCHIATRIC:  Normal affect   ASSESSMENT:    Exercise intolerance. Will work up with an exercise stress test.  I recommend to continue  with lifestyle modification and CVD risk mitigation including yearly A1c, lipid monitoring to assess for ASVD risk. Discussed that if her stress test is normal, may be related to deconditioning. We discussed slowing increasing her exercise routine.    PLAN:    In order of problems listed above:  Exercise stress echo Follow up as needed pending results      Shared Decision Making/Informed Consent The risks [chest pain, shortness of breath, cardiac arrhythmias, dizziness, blood pressure fluctuations, myocardial infarction, stroke/transient ischemic attack, and life-threatening complications (estimated to be 1 in 10,000)], benefits (risk stratification, diagnosing coronary artery disease, treatment guidance) and alternatives of a stress or  dobutamine stress echocardiogram were discussed in detail with Kari Fry and she agrees to proceed.    Medication Adjustments/Labs and Tests Ordered: Current medicines are reviewed at length with the patient today.  Concerns regarding medicines are outlined above.  Orders Placed This Encounter  Procedures   Cardiac Stress Test: Informed Consent Details: Physician/Practitioner Attestation; Transcribe to consent form and obtain patient signature   EKG 12-Lead   ECHOCARDIOGRAM STRESS TEST   No orders of the defined types were placed in this encounter.   Patient Instructions  Medication Instructions:  No changes *If you need a refill on your cardiac medications before your next appointment, please call your pharmacy*   Lab Work: None ordered If you have labs (blood work) drawn today and your tests are completely normal, you will receive your results only by: [place] (if you have MyChart) OR A paper copy in the mail If you have any lab test that is abnormal or we need to change your treatment, we will call you to review the results.   Testing/Procedures: Dr. Harl Bowie has ordered an echo stress test. This will be done at the [facility] office   Follow-Up: At [facility], you and your health needs are our priority.  As part of our continuing mission to provide you with exceptional heart care, we have created designated Provider Care Teams.  These Care Teams include your primary Cardiologist (physician) and Advanced Practice Providers (APPs -  Physician Assistants and Nurse Practitioners) who all work together to provide you with the care you need, when you need it.  We recommend signing up for the patient portal called "MyChart".  Sign up information is provided on this After Visit Summary.  MyChart is used to connect with patients for Virtual Visits (Telemedicine).  Patients are able to view lab/test results, encounter notes, upcoming appointments, etc.  Non-urgent messages can  be sent to your provider as well.   To learn more about what you can do with MyChart, go to [email or web address].    Your next appointment:   Follow up as needed with Dr. Harl Bowie  Exercise Stress Echocardiogram An exercise stress echocardiogram is a test to check how well your heart is working. This test uses sound waves (ultrasound) and a computer to make images of your heart before and after exercise. Ultrasound images that are taken before you exercise (resting echocardiogram) will show how much blood is getting to your heart muscle and how well your heart muscle and heart valves are functioning. During the next part of this test, you will walk on a treadmill or ride a stationary bicycle to see how exercise affects your heart. While you exercise, the electrical activity of your heart will be monitored with an electrocardiogram (ECG). Your blood pressure will also be monitored. You may have this test if you have: Chest pain or  other symptoms of a heart problem. Recently had a heart attack or heart surgery. Heart valve problems. A condition that causes narrowing of the blood vessels that supply your heart (coronary artery disease). A high risk of heart disease and are starting a new exercise program. A high risk of heart disease and need to have major surgery. Heart arrhythmia (abnormal heart rhythm) problems. Heart failure problems. Tell a health care provider about: Any allergies you have. All medicines you are taking, including vitamins, herbs, eye drops, creams, and over-the-counter medicines. Any problems you or family members have had with anesthetic medicines. Any surgeries you have had. Any blood disorders you have. Any medical conditions you have. Whether you are pregnant or may be pregnant. What are the risks? Generally, this is a safe test. However, problems may occur, including: Chest pain. Dizziness or light-headedness. Shortness of breath. Increased or irregular  heartbeat (palpitations). Nausea or vomiting. Heart attack. This is very rare. What happens before the test? Medicines Ask your health care provider about changing or stopping your regular medicines. This is especially important if you are taking diabetes medicines or blood thinners. If you use an inhaler, bring it with you to the test. General instructions Wear loose, comfortable clothing and walking shoes. Follow instructions from your health care provider about eating or drinking restrictions. You may be asked to avoid all forms of caffeine for 24 hours before your test, or as told by your health care provider. Do not use any products that contain nicotine or tobacco for 4 hours before the test, or as told by your health care provider. These products include cigarettes, chewing tobacco, and vaping devices, such as e-cigarettes. If you need help quitting, ask your health care provider. What happens during the test?  You will take off your clothes from the waist up and put on a hospital gown. Electrodes or electrocardiogram (ECG) patches may be placed on your chest. The electrodes or patches are then connected to a device that monitors your heart rate and rhythm. A blood pressure cuff will be placed on your arm. You will lie down on a table for an ultrasound exam before you exercise. A gel will be applied to your chest to help sound waves pass through your skin. A handheld device, called a transducer, will be pressed against your chest and moved over your heart. The transducer produces sound waves that travel to your heart and bounce back (or "echo" back) to the transducer. These sound waves will be captured in real-time and changed into images of your heart that can be viewed on a video monitor. The images will be recorded on a computer and reviewed by your health care provider. Once the ultrasound is complete, you will start exercising by walking on a treadmill or pedaling a stationary  bicycle. Your blood pressure and heart rhythm will be monitored while you exercise. The exercise will gradually get harder or faster. You will exercise until: Your heart reaches a target level. You are too tired to continue. You cannot continue because of chest pain, weakness, or dizziness. You will have another ultrasound exam immediately after you stop exercising. The procedure may vary among health care providers and hospitals. What can I expect after the test? After your test, it is common to have: Mild soreness. Mild fatigue. Your heart rate and blood pressure will be monitored until they return to your normal levels. You should not have any new symptoms after this test. Follow these instructions at home: After your  stress test, you should be able to return to your normal activities and diet. Take over-the-counter and prescription medicines only as told by your health care provider. Keep all follow-up visits. This is important. It is up to you to get the results of your test. Ask your health care provider, or the department that is doing the test, when your results will be ready. Contact a health care provider if you: Feel dizzy or light-headed. Have a fast or irregular heartbeat. Have nausea or vomiting. Have a headache. Feel short of breath. Get help right away if you: Develop pain or pressure: In your chest. In your jaw or neck. Between your shoulder blades. Radiating down your left arm. Faint. Have trouble breathing. These symptoms may represent a serious problem that is an emergency. Do not wait to see if the symptoms will go away. Get medical help right away. Call your local emergency services (911 in the [place] Do not drive yourself to the hospital. Summary An exercise stress echocardiogram is a test that uses ultrasound to check how well your heart works before and after exercise. Before the test, follow instructions from your health care provider about stopping  medicines and avoiding caffeine, nicotine and tobacco, and certain foods and drinks. During the test, your blood pressure and heart rhythm will be monitored while you exercise on a treadmill or stationary bicycle. This information is not intended to replace advice given to you by your health care provider. Make sure you discuss any questions you have with your health care provider. Document Revised: 10/03/2020 Document Reviewed: 09/13/2019 Elsevier Patient Education  [place], Janina Mayo, MD  07/03/2021 12:02 PM    [place]

## 2021-07-03 NOTE — Telephone Encounter (Signed)
   Pre-operative Risk Assessment    Patient Name: Kari Fry  DOB: October 08, 1960 MRN: [id number]      Request for Surgical Clearance    Procedure:   Colonoscopy with Propofol  Date of Surgery:  Clearance 09/04/21                                 Surgeon:  Dr. Juanita Craver  Surgeon's Group or Practice Name:  [facility]  Phone number:  [phone] Fax number:  [phone]   Type of Clearance Requested:   - Medical    Type of Anesthesia:  Not Indicated   Additional requests/questions:    SignedZebedee Iba   07/03/2021, 4:06 PM

## 2021-07-24 ENCOUNTER — Telehealth: Payer: Self-pay

## 2021-07-24 ENCOUNTER — Telehealth: Payer: Self-pay | Admitting: Internal Medicine

## 2021-07-24 DIAGNOSIS — R6889 Other general symptoms and signs: Secondary | ICD-10-CM

## 2021-07-24 NOTE — Telephone Encounter (Signed)
Spoke with the patient, detailed instructions given. She stated she wouldn't be able to walk on the treadmill fast, due to bra size. She stated that she would call Dr. Nelly Laurence office for further instructions. Kari Fry  EMTP

## 2021-07-24 NOTE — Telephone Encounter (Signed)
Patient called stating they told her the Stress Echo was not the appropriate test for her because they have to do the test for a hospital gown. Patient is a 36L in bra size and she can't not do this test with a  bra on.  They said to call your doctor back for a different type of stress test.

## 2021-07-24 NOTE — Telephone Encounter (Signed)
Called pt. She states she is 169 pounds and wears a size 36L bra. She states they told her "this is not an appropriate test for you." Pt states "I can not run on anything without a bra." Will get message to Dr. Harl Bowie for review.

## 2021-07-25 NOTE — Telephone Encounter (Signed)
Janina Mayo, MD  You; Orvan July, RN 6 hours ago (10:03 AM)   MB Ok, please schedule a coronary CTA for her and a surface echo    Echo ordered and coronary CTA.    BMET order placed and patient is aware of instructions.   MyChart message sent to patient with CCTA instructions.   Advised patient to call back to office with any issues, questions, or concerns. Patient verbalized understanding.

## 2021-07-29 NOTE — Telephone Encounter (Signed)
   Patient Name: Kari Fry  DOB: Sep 21, 1960 MRN: [id number]  Primary Cardiologist: Maisie Fus, MD  Chart reviewed as part of pre-operative protocol coverage.  Patient recently seen in clinic 07/03/21 for evaluation of exercise intolerance. Dr. Carolan Clines recommended to obtain stress echo. Appears patient has cancelled study on 08/01/21. Will route to callback to communicate with patient to let her know we would need to proceed with study to clear for colonoscopy. Will route update to requesting MD via fax to let them know we will provide clearance recommendation once testing has been completed   Laurann Montana, PA-C 07/29/2021, 8:32 AM

## 2021-08-01 ENCOUNTER — Other Ambulatory Visit (HOSPITAL_COMMUNITY): Payer: 59

## 2021-08-01 ENCOUNTER — Encounter (HOSPITAL_COMMUNITY): Payer: Self-pay

## 2021-08-12 ENCOUNTER — Ambulatory Visit (HOSPITAL_COMMUNITY): Payer: 59 | Attending: Cardiology

## 2021-08-12 DIAGNOSIS — I371 Nonrheumatic pulmonary valve insufficiency: Secondary | ICD-10-CM | POA: Diagnosis not present

## 2021-08-12 DIAGNOSIS — R6889 Other general symptoms and signs: Secondary | ICD-10-CM | POA: Insufficient documentation

## 2021-08-14 LAB — ECHOCARDIOGRAM COMPLETE
Area-P 1/2: 3.65 cm2
S' Lateral: 2.5 cm

## 2021-08-14 NOTE — Telephone Encounter (Signed)
Orders changed due to patient's breast size. She has completed 2D echo and is awaiting CCTA (no date yet)

## 2021-08-16 ENCOUNTER — Other Ambulatory Visit: Payer: Self-pay | Admitting: Internal Medicine

## 2021-08-16 DIAGNOSIS — R6889 Other general symptoms and signs: Secondary | ICD-10-CM

## 2021-08-19 ENCOUNTER — Telehealth: Payer: Self-pay | Admitting: Internal Medicine

## 2021-08-19 ENCOUNTER — Telehealth: Payer: Self-pay | Admitting: Cardiology

## 2021-08-19 NOTE — Telephone Encounter (Signed)
   Pre-operative Risk Assessment    Patient Name: Kari Fry  DOB: October 21, 1960 MRN: [id number]      Request for Surgical Clearance    Procedure:   Colonoscopy   Date of Surgery:  Clearance 09/04/21                                 Surgeon:  Dr. Johnney Killian Group or Practice Name:  [facility]  Phone number:  [phone] Fax number:  [phone]   Type of Clearance Requested:   - Medical    Type of Anesthesia:   Propofol    Additional requests/questions:    Sandrea Hammond   08/19/2021, 5:25 PM

## 2021-08-20 NOTE — Telephone Encounter (Signed)
This is duplicate request.  See other encounter.  Echo reassuring.  Pending ETT versus coronary CT prior to final recommendations

## 2021-09-05 ENCOUNTER — Telehealth (HOSPITAL_BASED_OUTPATIENT_CLINIC_OR_DEPARTMENT_OTHER): Payer: Self-pay | Admitting: Internal Medicine

## 2021-09-05 NOTE — Telephone Encounter (Signed)
Received staff message from [place] requesting help with scheduling the Cardiac CTA ordered for the patient's colonoscopy clearance.  Patient states she is not going to have the CTA or the colonoscopy done at this time.  "Pre Op Pool" notified

## 2021-09-05 NOTE — Telephone Encounter (Signed)
Good morning Zigmund Daniel,   Can you please help with this. Looks like Dr. Harl Bowie ordered a CTA. The pt needs pre op clearance and looks like we are just waiting for the CTA to be done before we can clear the pt. I am not sure if you are scheduling the CTA or someone else does, sorry. I appreciate any help that you can give in this matter.   Thank you  Arbie Cookey

## 2021-09-05 NOTE — Telephone Encounter (Signed)
I will send a message to Dr. Nelly Laurence nurse/cma to see about the scheduling of CT.

## 2021-09-05 NOTE — Telephone Encounter (Signed)
Kari Fry, CMA I just spoke with this patient and she states she has spoken with Dr. Collene Mares and she has decided not to have the CTA or the colonoscopy done at this [email or web address]  I will place this information in a phone note.           I will update the pre op provider.

## 2021-09-17 DIAGNOSIS — H401123 Primary open-angle glaucoma, left eye, severe stage: Secondary | ICD-10-CM | POA: Diagnosis not present

## 2021-09-17 DIAGNOSIS — H401111 Primary open-angle glaucoma, right eye, mild stage: Secondary | ICD-10-CM | POA: Diagnosis not present

## 2021-09-17 DIAGNOSIS — H0288B Meibomian gland dysfunction left eye, upper and lower eyelids: Secondary | ICD-10-CM | POA: Diagnosis not present

## 2021-09-17 DIAGNOSIS — H0102A Squamous blepharitis right eye, upper and lower eyelids: Secondary | ICD-10-CM | POA: Diagnosis not present

## 2021-09-17 DIAGNOSIS — H0102B Squamous blepharitis left eye, upper and lower eyelids: Secondary | ICD-10-CM | POA: Diagnosis not present

## 2021-09-17 DIAGNOSIS — H0288A Meibomian gland dysfunction right eye, upper and lower eyelids: Secondary | ICD-10-CM | POA: Diagnosis not present

## 2021-09-17 DIAGNOSIS — H2513 Age-related nuclear cataract, bilateral: Secondary | ICD-10-CM | POA: Diagnosis not present

## 2021-09-17 DIAGNOSIS — H04123 Dry eye syndrome of bilateral lacrimal glands: Secondary | ICD-10-CM | POA: Diagnosis not present

## 2021-09-17 DIAGNOSIS — H43812 Vitreous degeneration, left eye: Secondary | ICD-10-CM | POA: Diagnosis not present

## 2021-10-08 DIAGNOSIS — H401123 Primary open-angle glaucoma, left eye, severe stage: Secondary | ICD-10-CM | POA: Diagnosis not present

## 2021-10-22 DIAGNOSIS — H401111 Primary open-angle glaucoma, right eye, mild stage: Secondary | ICD-10-CM | POA: Diagnosis not present

## 2021-12-03 DIAGNOSIS — H04123 Dry eye syndrome of bilateral lacrimal glands: Secondary | ICD-10-CM | POA: Diagnosis not present

## 2021-12-03 DIAGNOSIS — H0288A Meibomian gland dysfunction right eye, upper and lower eyelids: Secondary | ICD-10-CM | POA: Diagnosis not present

## 2021-12-03 DIAGNOSIS — H43812 Vitreous degeneration, left eye: Secondary | ICD-10-CM | POA: Diagnosis not present

## 2021-12-03 DIAGNOSIS — H401123 Primary open-angle glaucoma, left eye, severe stage: Secondary | ICD-10-CM | POA: Diagnosis not present

## 2021-12-03 DIAGNOSIS — H0288B Meibomian gland dysfunction left eye, upper and lower eyelids: Secondary | ICD-10-CM | POA: Diagnosis not present

## 2021-12-03 DIAGNOSIS — H0102A Squamous blepharitis right eye, upper and lower eyelids: Secondary | ICD-10-CM | POA: Diagnosis not present

## 2021-12-03 DIAGNOSIS — H401111 Primary open-angle glaucoma, right eye, mild stage: Secondary | ICD-10-CM | POA: Diagnosis not present

## 2021-12-03 DIAGNOSIS — H2513 Age-related nuclear cataract, bilateral: Secondary | ICD-10-CM | POA: Diagnosis not present

## 2021-12-03 DIAGNOSIS — H0102B Squamous blepharitis left eye, upper and lower eyelids: Secondary | ICD-10-CM | POA: Diagnosis not present

## 2022-01-17 DIAGNOSIS — H04123 Dry eye syndrome of bilateral lacrimal glands: Secondary | ICD-10-CM | POA: Diagnosis not present

## 2022-01-17 DIAGNOSIS — H0288A Meibomian gland dysfunction right eye, upper and lower eyelids: Secondary | ICD-10-CM | POA: Diagnosis not present

## 2022-01-17 DIAGNOSIS — H0102B Squamous blepharitis left eye, upper and lower eyelids: Secondary | ICD-10-CM | POA: Diagnosis not present

## 2022-01-17 DIAGNOSIS — H43812 Vitreous degeneration, left eye: Secondary | ICD-10-CM | POA: Diagnosis not present

## 2022-01-17 DIAGNOSIS — H0288B Meibomian gland dysfunction left eye, upper and lower eyelids: Secondary | ICD-10-CM | POA: Diagnosis not present

## 2022-01-17 DIAGNOSIS — H401111 Primary open-angle glaucoma, right eye, mild stage: Secondary | ICD-10-CM | POA: Diagnosis not present

## 2022-01-17 DIAGNOSIS — H401123 Primary open-angle glaucoma, left eye, severe stage: Secondary | ICD-10-CM | POA: Diagnosis not present

## 2022-01-17 DIAGNOSIS — H2513 Age-related nuclear cataract, bilateral: Secondary | ICD-10-CM | POA: Diagnosis not present

## 2022-01-17 DIAGNOSIS — H0102A Squamous blepharitis right eye, upper and lower eyelids: Secondary | ICD-10-CM | POA: Diagnosis not present

## 2022-02-26 DIAGNOSIS — H401123 Primary open-angle glaucoma, left eye, severe stage: Secondary | ICD-10-CM | POA: Diagnosis not present

## 2022-02-26 DIAGNOSIS — H2513 Age-related nuclear cataract, bilateral: Secondary | ICD-10-CM | POA: Diagnosis not present

## 2022-02-26 DIAGNOSIS — H04123 Dry eye syndrome of bilateral lacrimal glands: Secondary | ICD-10-CM | POA: Diagnosis not present

## 2022-02-26 DIAGNOSIS — H0288A Meibomian gland dysfunction right eye, upper and lower eyelids: Secondary | ICD-10-CM | POA: Diagnosis not present

## 2022-02-26 DIAGNOSIS — H43812 Vitreous degeneration, left eye: Secondary | ICD-10-CM | POA: Diagnosis not present

## 2022-02-26 DIAGNOSIS — H0288B Meibomian gland dysfunction left eye, upper and lower eyelids: Secondary | ICD-10-CM | POA: Diagnosis not present

## 2022-02-26 DIAGNOSIS — H0102A Squamous blepharitis right eye, upper and lower eyelids: Secondary | ICD-10-CM | POA: Diagnosis not present

## 2022-02-26 DIAGNOSIS — H401111 Primary open-angle glaucoma, right eye, mild stage: Secondary | ICD-10-CM | POA: Diagnosis not present

## 2022-02-26 DIAGNOSIS — H0102B Squamous blepharitis left eye, upper and lower eyelids: Secondary | ICD-10-CM | POA: Diagnosis not present

## 2022-06-02 DIAGNOSIS — H0288A Meibomian gland dysfunction right eye, upper and lower eyelids: Secondary | ICD-10-CM | POA: Diagnosis not present

## 2022-06-02 DIAGNOSIS — H2513 Age-related nuclear cataract, bilateral: Secondary | ICD-10-CM | POA: Diagnosis not present

## 2022-06-02 DIAGNOSIS — H0102A Squamous blepharitis right eye, upper and lower eyelids: Secondary | ICD-10-CM | POA: Diagnosis not present

## 2022-06-02 DIAGNOSIS — Z9889 Other specified postprocedural states: Secondary | ICD-10-CM | POA: Diagnosis not present

## 2022-06-02 DIAGNOSIS — H0102B Squamous blepharitis left eye, upper and lower eyelids: Secondary | ICD-10-CM | POA: Diagnosis not present

## 2022-06-02 DIAGNOSIS — H401111 Primary open-angle glaucoma, right eye, mild stage: Secondary | ICD-10-CM | POA: Diagnosis not present

## 2022-06-02 DIAGNOSIS — H0288B Meibomian gland dysfunction left eye, upper and lower eyelids: Secondary | ICD-10-CM | POA: Diagnosis not present

## 2022-06-02 DIAGNOSIS — H04123 Dry eye syndrome of bilateral lacrimal glands: Secondary | ICD-10-CM | POA: Diagnosis not present

## 2022-06-02 DIAGNOSIS — H401123 Primary open-angle glaucoma, left eye, severe stage: Secondary | ICD-10-CM | POA: Diagnosis not present

## 2022-06-10 ENCOUNTER — Other Ambulatory Visit (HOSPITAL_COMMUNITY): Payer: Self-pay | Admitting: Family Medicine

## 2022-06-10 DIAGNOSIS — H938X9 Other specified disorders of ear, unspecified ear: Secondary | ICD-10-CM | POA: Diagnosis not present

## 2022-06-10 DIAGNOSIS — D649 Anemia, unspecified: Secondary | ICD-10-CM | POA: Diagnosis not present

## 2022-06-10 DIAGNOSIS — E785 Hyperlipidemia, unspecified: Secondary | ICD-10-CM | POA: Diagnosis not present

## 2022-06-10 DIAGNOSIS — E663 Overweight: Secondary | ICD-10-CM | POA: Diagnosis not present

## 2022-06-10 DIAGNOSIS — Z1231 Encounter for screening mammogram for malignant neoplasm of breast: Secondary | ICD-10-CM

## 2022-06-10 DIAGNOSIS — Z0001 Encounter for general adult medical examination with abnormal findings: Secondary | ICD-10-CM | POA: Diagnosis not present

## 2022-06-10 DIAGNOSIS — Z1331 Encounter for screening for depression: Secondary | ICD-10-CM | POA: Diagnosis not present

## 2022-06-10 DIAGNOSIS — R7309 Other abnormal glucose: Secondary | ICD-10-CM | POA: Diagnosis not present

## 2022-06-19 ENCOUNTER — Ambulatory Visit (HOSPITAL_COMMUNITY): Payer: Self-pay

## 2022-06-23 ENCOUNTER — Inpatient Hospital Stay (HOSPITAL_COMMUNITY): Admission: RE | Admit: 2022-06-23 | Payer: Self-pay | Source: Ambulatory Visit

## 2022-07-03 ENCOUNTER — Encounter (HOSPITAL_COMMUNITY): Payer: Self-pay | Admitting: Radiology

## 2022-07-03 ENCOUNTER — Ambulatory Visit (HOSPITAL_COMMUNITY)
Admission: RE | Admit: 2022-07-03 | Discharge: 2022-07-03 | Disposition: A | Discharge status: Home / Self Care | Payer: 59 | Source: Ambulatory Visit | Attending: Family Medicine | Admitting: Family Medicine

## 2022-07-03 DIAGNOSIS — Z1231 Encounter for screening mammogram for malignant neoplasm of breast: Secondary | ICD-10-CM | POA: Diagnosis not present

## 2022-07-08 DIAGNOSIS — H0102B Squamous blepharitis left eye, upper and lower eyelids: Secondary | ICD-10-CM | POA: Diagnosis not present

## 2022-07-08 DIAGNOSIS — H04123 Dry eye syndrome of bilateral lacrimal glands: Secondary | ICD-10-CM | POA: Diagnosis not present

## 2022-07-08 DIAGNOSIS — H43812 Vitreous degeneration, left eye: Secondary | ICD-10-CM | POA: Diagnosis not present

## 2022-07-08 DIAGNOSIS — H0102A Squamous blepharitis right eye, upper and lower eyelids: Secondary | ICD-10-CM | POA: Diagnosis not present

## 2022-07-08 DIAGNOSIS — H0288A Meibomian gland dysfunction right eye, upper and lower eyelids: Secondary | ICD-10-CM | POA: Diagnosis not present

## 2022-07-08 DIAGNOSIS — H401111 Primary open-angle glaucoma, right eye, mild stage: Secondary | ICD-10-CM | POA: Diagnosis not present

## 2022-07-08 DIAGNOSIS — H401123 Primary open-angle glaucoma, left eye, severe stage: Secondary | ICD-10-CM | POA: Diagnosis not present

## 2022-07-08 DIAGNOSIS — H0288B Meibomian gland dysfunction left eye, upper and lower eyelids: Secondary | ICD-10-CM | POA: Diagnosis not present

## 2022-07-08 DIAGNOSIS — H2513 Age-related nuclear cataract, bilateral: Secondary | ICD-10-CM | POA: Diagnosis not present

## 2022-08-20 DIAGNOSIS — H0102B Squamous blepharitis left eye, upper and lower eyelids: Secondary | ICD-10-CM | POA: Diagnosis not present

## 2022-08-20 DIAGNOSIS — H401111 Primary open-angle glaucoma, right eye, mild stage: Secondary | ICD-10-CM | POA: Diagnosis not present

## 2022-08-20 DIAGNOSIS — H43812 Vitreous degeneration, left eye: Secondary | ICD-10-CM | POA: Diagnosis not present

## 2022-08-20 DIAGNOSIS — H04123 Dry eye syndrome of bilateral lacrimal glands: Secondary | ICD-10-CM | POA: Diagnosis not present

## 2022-08-20 DIAGNOSIS — H0288A Meibomian gland dysfunction right eye, upper and lower eyelids: Secondary | ICD-10-CM | POA: Diagnosis not present

## 2022-08-20 DIAGNOSIS — H401123 Primary open-angle glaucoma, left eye, severe stage: Secondary | ICD-10-CM | POA: Diagnosis not present

## 2022-08-20 DIAGNOSIS — H0102A Squamous blepharitis right eye, upper and lower eyelids: Secondary | ICD-10-CM | POA: Diagnosis not present

## 2022-08-20 DIAGNOSIS — H0288B Meibomian gland dysfunction left eye, upper and lower eyelids: Secondary | ICD-10-CM | POA: Diagnosis not present

## 2022-08-20 DIAGNOSIS — H2513 Age-related nuclear cataract, bilateral: Secondary | ICD-10-CM | POA: Diagnosis not present

## 2023-10-06 ENCOUNTER — Other Ambulatory Visit (HOSPITAL_COMMUNITY): Payer: Self-pay | Admitting: Family Medicine

## 2023-10-06 DIAGNOSIS — Z1231 Encounter for screening mammogram for malignant neoplasm of breast: Secondary | ICD-10-CM

## 2023-10-12 ENCOUNTER — Encounter (HOSPITAL_COMMUNITY): Payer: Self-pay

## 2023-10-12 ENCOUNTER — Ambulatory Visit (HOSPITAL_COMMUNITY)
Admission: RE | Admit: 2023-10-12 | Discharge: 2023-10-12 | Disposition: A | Discharge status: Home / Self Care | Source: Ambulatory Visit | Attending: Family Medicine | Admitting: Family Medicine

## 2023-10-12 DIAGNOSIS — Z1231 Encounter for screening mammogram for malignant neoplasm of breast: Secondary | ICD-10-CM | POA: Insufficient documentation
